# Patient Record
Sex: Female | Born: 1965
Health system: Southern US, Community
[De-identification: ages and names within clinical notes are randomized; demographics above are authoritative.]

## PROBLEM LIST (undated history)

## (undated) DIAGNOSIS — J302 Other seasonal allergic rhinitis: Secondary | ICD-10-CM

## (undated) DIAGNOSIS — D649 Anemia, unspecified: Secondary | ICD-10-CM

## (undated) DIAGNOSIS — F419 Anxiety disorder, unspecified: Secondary | ICD-10-CM

## (undated) DIAGNOSIS — F32A Depression, unspecified: Secondary | ICD-10-CM

## (undated) DIAGNOSIS — K219 Gastro-esophageal reflux disease without esophagitis: Secondary | ICD-10-CM

## (undated) HISTORY — DX: Other seasonal allergic rhinitis: J30.2

## (undated) HISTORY — DX: Depression, unspecified: F32.A

## (undated) HISTORY — DX: Anxiety disorder, unspecified: F41.9

---

## 1991-08-10 HISTORY — PX: CHOLECYSTECTOMY: SHX55

## 2005-03-15 ENCOUNTER — Ambulatory Visit (HOSPITAL_COMMUNITY): Admission: RE | Admit: 2005-03-15 | Discharge: 2005-03-15 | Payer: Self-pay | Admitting: Obstetrics and Gynecology

## 2009-06-20 ENCOUNTER — Ambulatory Visit (HOSPITAL_COMMUNITY): Admission: RE | Admit: 2009-06-20 | Discharge: 2009-06-20 | Payer: Self-pay | Admitting: Family Medicine

## 2011-06-11 ENCOUNTER — Other Ambulatory Visit (HOSPITAL_COMMUNITY): Payer: Self-pay | Admitting: Family Medicine

## 2011-06-11 DIAGNOSIS — Z139 Encounter for screening, unspecified: Secondary | ICD-10-CM

## 2011-06-14 ENCOUNTER — Ambulatory Visit (HOSPITAL_COMMUNITY)
Admission: RE | Admit: 2011-06-14 | Discharge: 2011-06-14 | Disposition: A | Discharge status: Home / Self Care | Payer: Self-pay | Source: Ambulatory Visit | Attending: Family Medicine | Admitting: Family Medicine

## 2011-06-14 DIAGNOSIS — Z139 Encounter for screening, unspecified: Secondary | ICD-10-CM

## 2011-12-11 ENCOUNTER — Emergency Department (HOSPITAL_COMMUNITY): Payer: Self-pay

## 2011-12-11 ENCOUNTER — Encounter (HOSPITAL_COMMUNITY): Payer: Self-pay | Admitting: *Deleted

## 2011-12-11 ENCOUNTER — Emergency Department (HOSPITAL_COMMUNITY)
Admission: EM | Admit: 2011-12-11 | Discharge: 2011-12-11 | Disposition: A | Discharge status: Home / Self Care | Payer: Self-pay | Attending: Emergency Medicine | Admitting: Emergency Medicine

## 2011-12-11 DIAGNOSIS — M79609 Pain in unspecified limb: Secondary | ICD-10-CM | POA: Insufficient documentation

## 2011-12-11 DIAGNOSIS — R51 Headache: Secondary | ICD-10-CM | POA: Insufficient documentation

## 2011-12-11 DIAGNOSIS — R209 Unspecified disturbances of skin sensation: Secondary | ICD-10-CM | POA: Insufficient documentation

## 2011-12-11 DIAGNOSIS — R200 Anesthesia of skin: Secondary | ICD-10-CM

## 2011-12-11 DIAGNOSIS — M79602 Pain in left arm: Secondary | ICD-10-CM

## 2011-12-11 DIAGNOSIS — M79605 Pain in left leg: Secondary | ICD-10-CM

## 2011-12-11 LAB — CBC WITH DIFFERENTIAL/PLATELET
Basophils Absolute: 0 10*3/uL (ref 0.0–0.1)
Basophils Relative: 1 % (ref 0–1)
Eosinophils Absolute: 0.2 10*3/uL (ref 0.0–0.7)
Eosinophils Relative: 2 % (ref 0–5)
Hemoglobin: 11.4 g/dL — ABNORMAL LOW (ref 12.0–15.0)
Lymphs Abs: 2.5 10*3/uL (ref 0.7–4.0)
MCH: 28.9 pg (ref 26.0–34.0)
Neutro Abs: 5.6 10*3/uL (ref 1.7–7.7)
Neutrophils Relative %: 63 % (ref 43–77)
RBC: 3.95 MIL/uL (ref 3.87–5.11)

## 2011-12-11 LAB — BASIC METABOLIC PANEL
CO2: 27 mEq/L (ref 19–32)
Calcium: 9.6 mg/dL (ref 8.4–10.5)
GFR calc non Af Amer: >90 mL/min (ref >90)
Sodium: 138 mEq/L (ref 135–145)

## 2011-12-11 MED ORDER — SODIUM CHLORIDE 0.9 % IV BOLUS (SEPSIS)
1000.0000 mL | Freq: Once | INTRAVENOUS | Status: AC
  Administered 2011-12-11: 1000 mL via INTRAVENOUS

## 2011-12-11 MED ORDER — DIPHENHYDRAMINE HCL 50 MG/ML IJ SOLN
25.0000 mg | Freq: Once | INTRAMUSCULAR | Status: AC
  Administered 2011-12-11: 25 mg via INTRAVENOUS
  Filled 2011-12-11: qty 1

## 2011-12-11 MED ORDER — KETOROLAC TROMETHAMINE 30 MG/ML IJ SOLN
30.0000 mg | Freq: Once | INTRAMUSCULAR | Status: AC
  Administered 2011-12-11: 30 mg via INTRAVENOUS
  Filled 2011-12-11: qty 1

## 2011-12-11 MED ORDER — METOCLOPRAMIDE HCL 10 MG PO TABS: 10.0000 mg | ORAL_TABLET | Freq: Four times a day (QID) | ORAL | Status: DC | PRN

## 2011-12-11 MED ORDER — METOCLOPRAMIDE HCL 5 MG/ML IJ SOLN
10.0000 mg | Freq: Once | INTRAMUSCULAR | Status: AC
  Administered 2011-12-11: 10 mg via INTRAVENOUS
  Filled 2011-12-11: qty 2

## 2011-12-11 NOTE — ED Notes (Signed)
Pt states numbness to left side of face which began at 0800. Also states shooting pains down left arm and other areas of body intermittently x 2 weeks. NAD.

## 2011-12-11 NOTE — ED Provider Notes (Signed)
History  This chart was scribed for Dione Booze, MD by Shari Heritage. The patient was seen in room APA08/APA08. Patient's care was started at 1036.     CSN: [id number]  Arrival date & time 12/11/11  7829   First MD Initiated Contact with Patient 12/11/11 1036      Chief Complaint  Patient presents with  . Numbness     The history is provided by the patient. No language interpreter was used.    HPI Comments: Cindy Nixon is a 46 y.o. female who presents to the Emergency Department complaining of numbness to the left side of her face onset about 3 hours ago at 8:00am. Patient is also complaining of a 1 hour episode of moderate to severe, constant sharp pain that traveled from her left arm down, down her torso and to her left leg. Patient rates that pain as 10/10. Other symptoms include moderate HA and nausea. Patient rates HA pain as 5/10. She also reports that she has had some numbness in her hands over the past few days. Patient says that she is being monitored for borderline stroke.  PCP - [facility]  History reviewed. No pertinent past medical history.  Past Surgical History  Procedure Date  . Cholecystectomy     No family history on file.  History  Substance Use Topics  . Smoking status: Never Smoker   . Smokeless tobacco: Not on file  . Alcohol Use: No    OB History    Grav Para Term Preterm Abortions TAB SAB Ect Mult Living                  Review of Systems  Gastrointestinal: Positive for nausea. Negative for vomiting.  Musculoskeletal: Positive for myalgias.  Neurological: Positive for numbness.    Allergies  Review of patient's allergies indicates no known allergies.  Home Medications   Current Outpatient Rx  Name Route Sig Dispense Refill  . CALCIUM PO Oral Take 1 tablet by mouth daily.    . IRON PO Oral Take 1 tablet by mouth 2 (two) times a week.    . ADULT MULTIVITAMIN W/MINERALS CH Oral Take 1 tablet by mouth daily.     Marland Kitchen NAPROXEN SODIUM 220 MG PO TABS Oral Take 220 mg by mouth 2 (two) times daily with a meal. Pain    . NORGESTIMATE-ETH ESTRADIOL 0.25-35 MG-MCG PO TABS Oral Take 1 tablet by mouth daily.    Marland Kitchen POTASSIUM PO Oral Take 1 tablet by mouth daily.    Marland Kitchen PSEUDOEPH-DOXYLAMINE-DM-APAP 60-12.08-07-998 MG/30ML PO LIQD Oral Take 15 mLs by mouth every 6 (six) hours as needed. Cold Symptoms    . PSEUDOEPHEDRINE-ACETAMINOPHEN 30-500 MG PO TABS Oral Take 2 tablets by mouth every 4 (four) hours as needed. Sinus Pain    . RANITIDINE HCL 150 MG PO TABS Oral Take 150 mg by mouth daily as needed. Acid Reflux      BP 151/69  Pulse 76  Temp 98.3 F (36.8 C) (Oral)  Resp 22  Ht 5' (1.524 m)  Wt 298 lb (135.172 kg)  BMI 58.20 kg/m2  SpO2 99%  LMP 12/10/2011  Physical Exam  Nursing note and vitals reviewed. Constitutional: She is oriented to person, place, and time. She appears well-developed and well-nourished.       Obese.  HENT:  Head: Normocephalic and atraumatic.  Nose: Right sinus exhibits maxillary sinus tenderness and frontal sinus tenderness. Left sinus exhibits maxillary sinus tenderness and frontal sinus tenderness.  Mild tenderness to palpation over frontal and maxillary sinuses.  Eyes: EOM are normal. Pupils are equal, round, and reactive to light.       Fundi are normal.   Cardiovascular: Normal rate and regular rhythm.   Pulmonary/Chest: Effort normal and breath sounds normal. No respiratory distress.  Abdominal: Soft. Bowel sounds are normal.  Musculoskeletal: Normal range of motion.  Neurological: She is alert and oriented to person, place, and time.  Skin: Skin is warm and dry.  Psychiatric: She has a normal mood and affect. Her behavior is normal.    ED Course  Procedures (including critical care time) DIAGNOSTIC STUDIES: Oxygen Saturation is 99% on room air, normal by my interpretation.    COORDINATION OF CARE: 10:50am- Patient informed of current plan for treatment and  evaluation and agrees with plan at this time.   Results for orders placed during the hospital encounter of 12/11/11  CBC WITH DIFFERENTIAL      Component Value Range   WBC 8.8  4.0 - 10.5 K/uL   RBC 3.95  3.87 - 5.11 MIL/uL   Hemoglobin 11.4 (*) 12.0 - 15.0 g/dL   HCT 40.1 (*) 02.7 - 25.3 %   MCV 89.6  78.0 - 100.0 fL   MCH 28.9  26.0 - 34.0 pg   MCHC 32.2  30.0 - 36.0 g/dL   RDW 66.4 (*) 40.3 - 47.4 %   Platelets 325  150 - 400 K/uL   Neutrophils Relative 63  43 - 77 %   Neutro Abs 5.6  1.7 - 7.7 K/uL   Lymphocytes Relative 28  12 - 46 %   Lymphs Abs 2.5  0.7 - 4.0 K/uL   Monocytes Relative 6  3 - 12 %   Monocytes Absolute 0.6  0.1 - 1.0 K/uL   Eosinophils Relative 2  0 - 5 %   Eosinophils Absolute 0.2  0.0 - 0.7 K/uL   Basophils Relative 1  0 - 1 %   Basophils Absolute 0.0  0.0 - 0.1 K/uL  BASIC METABOLIC PANEL      Component Value Range   Sodium 138  135 - 145 mEq/L   Potassium 4.1  3.5 - 5.1 mEq/L   Chloride 102  96 - 112 mEq/L   CO2 27  19 - 32 mEq/L   Glucose, Bld 91  70 - 99 mg/dL   BUN 12  6 - 23 mg/dL   Creatinine, Ser 2.59  0.50 - 1.10 mg/dL   Calcium 9.6  8.4 - 56.3 mg/dL   GFR calc non Af Amer >90  >90 mL/min   GFR calc Af Amer >90  >90 mL/min     Ct Head Wo Contrast  12/11/2011  *RADIOLOGY REPORT*  Clinical Data: Left-sided numbness.  CT HEAD WITHOUT CONTRAST  Technique:  Contiguous axial images were obtained from the base of the skull through the vertex without contrast.  Comparison: None.  Findings: No acute intracranial abnormality.  Specifically, no hemorrhage, hydrocephalus, mass lesion, acute infarction, or significant intracranial injury.  No acute calvarial abnormality. Visualized paranasal sinuses and mastoids clear.  Orbital soft tissues unremarkable.  IMPRESSION: No acute intracranial abnormality.   Original Report Authenticated By: Cyndie Chime, M.D.      1. Headache   2. Facial numbness   3. Pain of left arm   4. Pain of left leg        MDM  Transient numbness of uncertain cause. With numbness being the only symptom, I do  not feel there is enough to warrant TIA workup. Arm and leg pain of uncertain cause, but they have resolved. Headache which may be a migraine variant. CT scan will be obtained and she will be given a headache cocktail.  She feels much better after the headache cocktail. She is in a prescription for metoclopramide. Remainder of workup was unremarkable.  I personally performed the services described in this documentation, which was scribed in my presence. The recorded information has been reviewed and considered.      Dione Booze, MD 12/11/11 1248

## 2012-06-12 ENCOUNTER — Other Ambulatory Visit (HOSPITAL_COMMUNITY): Payer: Self-pay | Admitting: *Deleted

## 2012-06-12 ENCOUNTER — Other Ambulatory Visit (HOSPITAL_COMMUNITY): Payer: Self-pay | Admitting: Family Medicine

## 2012-06-12 DIAGNOSIS — Z1231 Encounter for screening mammogram for malignant neoplasm of breast: Secondary | ICD-10-CM

## 2012-06-16 ENCOUNTER — Ambulatory Visit (HOSPITAL_COMMUNITY)
Admission: RE | Admit: 2012-06-16 | Discharge: 2012-06-16 | Disposition: A | Discharge status: Home / Self Care | Payer: Self-pay | Source: Ambulatory Visit | Attending: *Deleted | Admitting: *Deleted

## 2012-06-16 DIAGNOSIS — Z1231 Encounter for screening mammogram for malignant neoplasm of breast: Secondary | ICD-10-CM

## 2012-06-18 ENCOUNTER — Other Ambulatory Visit: Payer: Self-pay | Admitting: *Deleted

## 2012-06-18 DIAGNOSIS — R928 Other abnormal and inconclusive findings on diagnostic imaging of breast: Secondary | ICD-10-CM

## 2012-06-24 ENCOUNTER — Other Ambulatory Visit: Payer: Self-pay | Admitting: *Deleted

## 2012-06-24 ENCOUNTER — Other Ambulatory Visit (HOSPITAL_COMMUNITY): Payer: Self-pay | Admitting: *Deleted

## 2012-06-24 ENCOUNTER — Ambulatory Visit (HOSPITAL_COMMUNITY)
Admission: RE | Admit: 2012-06-24 | Discharge: 2012-06-24 | Disposition: A | Discharge status: Home / Self Care | Payer: Self-pay | Source: Ambulatory Visit | Attending: *Deleted | Admitting: *Deleted

## 2012-06-24 DIAGNOSIS — N63 Unspecified lump in unspecified breast: Secondary | ICD-10-CM | POA: Insufficient documentation

## 2012-06-24 DIAGNOSIS — R928 Other abnormal and inconclusive findings on diagnostic imaging of breast: Secondary | ICD-10-CM | POA: Insufficient documentation

## 2012-06-24 DIAGNOSIS — IMO0002 Reserved for concepts with insufficient information to code with codable children: Secondary | ICD-10-CM

## 2012-06-26 ENCOUNTER — Other Ambulatory Visit: Payer: Self-pay | Admitting: Obstetrics and Gynecology

## 2012-06-26 DIAGNOSIS — N6320 Unspecified lump in the left breast, unspecified quadrant: Secondary | ICD-10-CM

## 2012-07-01 ENCOUNTER — Ambulatory Visit (HOSPITAL_COMMUNITY): Payer: Self-pay

## 2012-07-07 ENCOUNTER — Ambulatory Visit (HOSPITAL_COMMUNITY): Payer: Self-pay

## 2012-07-07 ENCOUNTER — Ambulatory Visit
Admission: RE | Admit: 2012-07-07 | Discharge: 2012-07-07 | Disposition: A | Discharge status: Home / Self Care | Payer: No Typology Code available for payment source | Source: Ambulatory Visit | Attending: Obstetrics and Gynecology | Admitting: Obstetrics and Gynecology

## 2012-07-07 ENCOUNTER — Encounter (HOSPITAL_COMMUNITY): Payer: Self-pay

## 2012-07-07 ENCOUNTER — Ambulatory Visit (HOSPITAL_COMMUNITY)
Admission: RE | Admit: 2012-07-07 | Discharge: 2012-07-07 | Disposition: A | Discharge status: Home / Self Care | Payer: Self-pay | Source: Ambulatory Visit | Attending: Obstetrics and Gynecology | Admitting: Obstetrics and Gynecology

## 2012-07-07 VITALS — BP 124/82 | Temp 98.0°F | Ht 60.0 in | Wt 325.2 lb

## 2012-07-07 DIAGNOSIS — Z1239 Encounter for other screening for malignant neoplasm of breast: Secondary | ICD-10-CM

## 2012-07-07 DIAGNOSIS — N6320 Unspecified lump in the left breast, unspecified quadrant: Secondary | ICD-10-CM

## 2012-07-07 NOTE — Progress Notes (Signed)
No complaints today. Patient referred to [facility] due to recommending a left breast biopsy per left breast diagnostic mammogram and ultrasound performed 06/24/2012 at [facility] mammography.  Pap Smear:    Pap smear not completed today. Last Pap smear was 06/11/2012 at the [facility] and normal. Per patient no history of an abnormal Pap smear. No Pap smear results in EPIC. Will scan last Pap smear in EPIC under media.  Physical exam: Breasts Breasts symmetrical. No skin abnormalities bilateral breasts. No nipple retraction bilateral breasts. No nipple discharge bilateral breasts. No lymphadenopathy. No lumps palpated bilateral breasts. Unable to palpate area of concern in the left breast at 3 o'clock. No complaints of pain or tenderness on exam. Referred patient to the [facility] for left breast biopsy per recommendation. Appointment scheduled for today at 0900.     Pelvic/Bimanual No Pap smear completed today since last Pap smear was 06/11/2012. Pap smear not indicated per BCCCP guidelines.

## 2012-07-07 NOTE — Patient Instructions (Signed)
Taught patient how to perform BSE. Patient did not need a Pap smear today due to last Pap smear was 06/11/2012. Let her know BCCCP will cover Pap smears every 3 years unless has a history of abnormal Pap smears. Referred patient to the [facility] for left breast biopsy per recommendation. Appointment scheduled for today at 0900. tPatient aware of appointment and will be there. Patient verbalized understanding.

## 2012-07-08 ENCOUNTER — Encounter (HOSPITAL_COMMUNITY): Payer: Self-pay

## 2012-07-14 ENCOUNTER — Ambulatory Visit (HOSPITAL_COMMUNITY): Payer: Self-pay

## 2012-07-17 ENCOUNTER — Encounter (INDEPENDENT_AMBULATORY_CARE_PROVIDER_SITE_OTHER): Payer: Self-pay | Admitting: Surgery

## 2012-07-17 ENCOUNTER — Other Ambulatory Visit (INDEPENDENT_AMBULATORY_CARE_PROVIDER_SITE_OTHER): Payer: Self-pay | Admitting: Surgery

## 2012-07-17 ENCOUNTER — Ambulatory Visit (INDEPENDENT_AMBULATORY_CARE_PROVIDER_SITE_OTHER): Payer: PRIVATE HEALTH INSURANCE | Admitting: Surgery

## 2012-07-17 VITALS — BP 142/74 | HR 60 | Resp 18 | Ht 60.0 in | Wt 321.0 lb

## 2012-07-17 DIAGNOSIS — N6320 Unspecified lump in the left breast, unspecified quadrant: Secondary | ICD-10-CM

## 2012-07-17 DIAGNOSIS — N63 Unspecified lump in unspecified breast: Secondary | ICD-10-CM

## 2012-07-17 NOTE — Patient Instructions (Signed)
We will schedule surgery to remove the lump recently found in your left breast

## 2012-07-17 NOTE — Progress Notes (Signed)
Patient ID: Cindy Nixon, female   DOB: 20-Feb-1966, 47 y.o.   MRN: [id number]  Chief Complaint  Patient presents with  . Breast Mass    left    HPI Cindy Nixon is a 47 y.o. female.  She had a routine mammogram and a well-circumscribed mass is noted in the lateral aspect of the left breast. A needle core biopsy was done showing a biphasic lesion which could be a fibroadenoma but could be a phyllodes tumor She has no breast symptoms or breast problems and has a negative family history for breast cancer.  HPI  Past Medical History  Diagnosis Date  . Seasonal allergies     Past Surgical History  Procedure Laterality Date  . Cholecystectomy  08/1991    Family History  Problem Relation Age of Onset  . Cancer Mother     ovarian  . Breast cancer Paternal Aunt   . Breast cancer Paternal Aunt   . Breast cancer Paternal Aunt     Social History History  Substance Use Topics  . Smoking status: Never Smoker   . Smokeless tobacco: Never Used  . Alcohol Use: No    No Known Allergies  Current Outpatient Prescriptions  Medication Sig Dispense Refill  . IRON PO Take 1 tablet by mouth 2 (two) times a week.      . Multiple Vitamin (MULTIVITAMIN WITH MINERALS) TABS Take 1 tablet by mouth daily.      . naproxen sodium (ALEVE) 220 MG tablet Take 220 mg by mouth 2 (two) times daily with a meal. Pain      . norgestimate-ethinyl estradiol (ORTHO-CYCLEN,SPRINTEC,PREVIFEM) 0.25-35 MG-MCG tablet Take 1 tablet by mouth daily.      Marland Kitchen POTASSIUM PO Take 1 tablet by mouth daily.      . pseudoephedrine-acetaminophen (TYLENOL SINUS) 30-500 MG TABS Take 2 tablets by mouth every 4 (four) hours as needed. Sinus Pain      . ranitidine (ZANTAC) 150 MG tablet Take 150 mg by mouth daily as needed. Acid Reflux      . Pseudoeph-Doxylamine-DM-APAP (NYQUIL D COLD/FLU) 60-12.08-07-998 MG/30ML LIQD Take 15 mLs by mouth every 6 (six) hours as needed. Cold Symptoms       No current facility-administered  medications for this visit.    Review of Systems Review of Systems  Constitutional: Negative for fever, chills and unexpected weight change.  HENT: Positive for ear pain and congestion. Negative for hearing loss, sore throat, trouble swallowing and voice change.   Eyes: Negative for visual disturbance.  Respiratory: Negative for cough and wheezing.   Cardiovascular: Negative for chest pain, palpitations and leg swelling.  Gastrointestinal: Negative for nausea, vomiting, abdominal pain, diarrhea, constipation, blood in stool, abdominal distention and anal bleeding.  Genitourinary: Negative for hematuria, vaginal bleeding and difficulty urinating.  Musculoskeletal: Negative for arthralgias.  Skin: Negative for rash and wound.  Neurological: Negative for seizures, syncope and headaches.  Hematological: Negative for adenopathy. Does not bruise/bleed easily.  Psychiatric/Behavioral: Negative for confusion.    Blood pressure 142/74, pulse 60, resp. rate 18, height 5' (1.524 m), weight 321 lb (145.605 kg), last menstrual period 07/06/2012.  Physical Exam Physical Exam  Vitals reviewed. Constitutional: She is oriented to person, place, and time. She appears well-developed and well-nourished. No distress.  HENT:  Head: Normocephalic and atraumatic.  Mouth/Throat: Oropharynx is clear and moist.  Eyes: Conjunctivae and EOM are normal. Pupils are equal, round, and reactive to light. No scleral icterus.  Right  Eye drifts lateral  Neck: Normal range of motion. Neck supple. No tracheal deviation present. No thyromegaly present.  Cardiovascular: Normal rate, regular rhythm, normal heart sounds and intact distal pulses.  Exam reveals no gallop and no friction rub.   No murmur heard. Pulmonary/Chest: Effort normal and breath sounds normal. No respiratory distress. She has no wheezes. She has no rales.    Well circumscribed 1.5 cm mass, ? Hematoma from Bx  Abdominal: Soft. Bowel sounds are normal.  She exhibits no distension and no mass. There is no tenderness. There is no rebound and no guarding.  Musculoskeletal: Normal range of motion. She exhibits no edema and no tenderness.  Lymphadenopathy:    She has cervical adenopathy.    She has no axillary adenopathy.       Right: No supraclavicular adenopathy present.       Left: No supraclavicular adenopathy present.  Neurological: She is alert and oriented to person, place, and time.  Skin: Skin is warm and dry. No rash noted. She is not diaphoretic. No erythema.  Psychiatric: She has a normal mood and affect. Her behavior is normal. Judgment and thought content normal.    Data Reviewed I reviewed the mammogram and ultrasounds and the pathology report from the biopsy  Assessment    Left breast mass, fibroadenoma versus phyllodes tumor  Morbid obesity    Plan    Excision. I have discussed the indications for the lumpectomy and described the procedure. She understand that the chance of removal of the abnormal area is very good, but that occasionally we are unable to locate it and may have to do a second procedure. We also discussed the possibility of a second procedure to get additional tissue. Risks of surgery such as bleeding and infection have also been explained, as well as the implications of not doing the surgery. She understands and wishes to proceed.         Pleasant Bensinger J 07/17/2012, 10:14 AM

## 2012-08-04 ENCOUNTER — Encounter (HOSPITAL_COMMUNITY): Payer: Self-pay | Admitting: Pharmacy Technician

## 2012-08-06 ENCOUNTER — Encounter (HOSPITAL_COMMUNITY): Payer: Self-pay

## 2012-08-06 ENCOUNTER — Encounter (HOSPITAL_COMMUNITY)
Admission: RE | Admit: 2012-08-06 | Discharge: 2012-08-06 | Disposition: A | Discharge status: Home / Self Care | Payer: No Typology Code available for payment source | Source: Ambulatory Visit | Attending: Surgery | Admitting: Surgery

## 2012-08-06 HISTORY — DX: Gastro-esophageal reflux disease without esophagitis: K21.9

## 2012-08-06 HISTORY — DX: Anemia, unspecified: D64.9

## 2012-08-06 LAB — BASIC METABOLIC PANEL
CO2: 24 mEq/L (ref 19–32)
Calcium: 9.6 mg/dL (ref 8.4–10.5)
Creatinine, Ser: 0.64 mg/dL (ref 0.50–1.10)
GFR calc non Af Amer: >90 mL/min (ref >90)
Glucose, Bld: 102 mg/dL — ABNORMAL HIGH (ref 70–99)

## 2012-08-06 LAB — CBC
Hemoglobin: 11.4 g/dL — ABNORMAL LOW (ref 12.0–15.0)
MCH: 28.5 pg (ref 26.0–34.0)
MCHC: 32.1 g/dL (ref 30.0–36.0)
MCV: 88.8 fL (ref 78.0–100.0)
Platelets: 317 10*3/uL (ref 150–400)
RBC: 4 MIL/uL (ref 3.87–5.11)

## 2012-08-06 NOTE — Pre-Procedure Instructions (Signed)
Cindy Nixon  08/06/2012   Your procedure is scheduled on:   Thursday  08/13/12     Report to [facility] at 900 AM.  Call this number if you have problems the morning of surgery: [phone]   Remember:   Do not eat food or drink liquids after midnight.   Take these medicines the morning of surgery with A SIP OF WATER:   ORTHOCYCLEN, ZANTAC, POTASSIUM ( STOP  ASPIRIN, COUMADIN, PLAVIX, EFFIENT, HERBAL MEDICINES)   Do not wear jewelry, make-up or nail polish.  Do not wear lotions, powders, or perfumes. You may wear deodorant.  Do not shave 48 hours prior to surgery. Men may shave face and neck.  Do not bring valuables to the hospital.  Contacts, dentures or bridgework may not be worn into surgery.  Leave suitcase in the car. After surgery it may be brought to your room.  For patients admitted to the hospital, checkout time is 11:00 AM the day of  discharge.   Patients discharged the day of surgery will not be allowed to drive  home.  Name and phone number of your driver: FAMILY OR FRIEND   Special Instructions: Shower using CHG 2 nights before surgery and the night before surgery.  If you shower the day of surgery use CHG.  Use special wash - you have one bottle of CHG for all showers.  You should use approximately 1/3 of the bottle for each shower.   Please read over the following fact sheets that you were given: Pain Booklet, Coughing and Deep Breathing, MRSA Information and Surgical Site Infection Prevention

## 2012-08-12 MED ORDER — DEXTROSE 5 % IV SOLN
3.0000 g | INTRAVENOUS | Status: AC
  Filled 2012-08-12: qty 30

## 2012-08-12 MED ORDER — CHLORHEXIDINE GLUCONATE 4 % EX LIQD
1.0000 "application " | Freq: Once | CUTANEOUS | Status: DC
  Filled 2012-08-12: qty 15

## 2012-08-13 ENCOUNTER — Encounter (HOSPITAL_COMMUNITY): Payer: Self-pay | Admitting: Anesthesiology

## 2012-08-13 ENCOUNTER — Encounter (HOSPITAL_COMMUNITY)
Admission: RE | Disposition: A | Discharge status: Home / Self Care | Payer: Self-pay | Source: Ambulatory Visit | Attending: Surgery

## 2012-08-13 ENCOUNTER — Ambulatory Visit (HOSPITAL_COMMUNITY): Payer: Self-pay | Admitting: Anesthesiology

## 2012-08-13 ENCOUNTER — Ambulatory Visit (HOSPITAL_COMMUNITY)
Admission: RE | Admit: 2012-08-13 | Discharge: 2012-08-13 | Disposition: A | Discharge status: Home / Self Care | Payer: MEDICAID | Source: Ambulatory Visit | Attending: Surgery | Admitting: Surgery

## 2012-08-13 ENCOUNTER — Ambulatory Visit
Admission: RE | Admit: 2012-08-13 | Discharge: 2012-08-13 | Disposition: A | Discharge status: Home / Self Care | Payer: No Typology Code available for payment source | Source: Ambulatory Visit | Attending: Surgery | Admitting: Surgery

## 2012-08-13 DIAGNOSIS — J309 Allergic rhinitis, unspecified: Secondary | ICD-10-CM | POA: Insufficient documentation

## 2012-08-13 DIAGNOSIS — N6320 Unspecified lump in the left breast, unspecified quadrant: Secondary | ICD-10-CM

## 2012-08-13 DIAGNOSIS — D249 Benign neoplasm of unspecified breast: Secondary | ICD-10-CM

## 2012-08-13 DIAGNOSIS — Z8041 Family history of malignant neoplasm of ovary: Secondary | ICD-10-CM | POA: Insufficient documentation

## 2012-08-13 DIAGNOSIS — Z803 Family history of malignant neoplasm of breast: Secondary | ICD-10-CM | POA: Insufficient documentation

## 2012-08-13 HISTORY — PX: BREAST BIOPSY: SHX20

## 2012-08-13 SURGERY — BREAST BIOPSY WITH NEEDLE LOCALIZATION
Anesthesia: General | Site: Breast | Laterality: Left | Wound class: Clean

## 2012-08-13 MED ORDER — ACETAMINOPHEN 10 MG/ML IV SOLN: 1000.0000 mg | Freq: Once | INTRAVENOUS | Status: DC | PRN

## 2012-08-13 MED ORDER — HYDROCODONE-ACETAMINOPHEN 5-325 MG PO TABS: 1.0000 | ORAL_TABLET | ORAL | Status: DC | PRN

## 2012-08-13 MED ORDER — ONDANSETRON HCL 4 MG/2ML IJ SOLN
INTRAMUSCULAR | Status: DC | PRN
  Administered 2012-08-13: 4 mg via INTRAVENOUS

## 2012-08-13 MED ORDER — LACTATED RINGERS IV SOLN
INTRAVENOUS | Status: DC
  Administered 2012-08-13: 11:00:00 via INTRAVENOUS

## 2012-08-13 MED ORDER — LACTATED RINGERS IV SOLN
INTRAVENOUS | Status: DC | PRN
  Administered 2012-08-13: 11:00:00 via INTRAVENOUS

## 2012-08-13 MED ORDER — BUPIVACAINE HCL (PF) 0.25 % IJ SOLN
INTRAMUSCULAR | Status: AC
  Filled 2012-08-13: qty 30

## 2012-08-13 MED ORDER — FENTANYL CITRATE 0.05 MG/ML IJ SOLN
INTRAMUSCULAR | Status: DC | PRN
  Administered 2012-08-13: 100 ug via INTRAVENOUS
  Administered 2012-08-13 (×2): 50 ug via INTRAVENOUS

## 2012-08-13 MED ORDER — BUPIVACAINE HCL (PF) 0.25 % IJ SOLN
INTRAMUSCULAR | Status: DC | PRN
  Administered 2012-08-13: 20 mL

## 2012-08-13 MED ORDER — PROPOFOL 10 MG/ML IV BOLUS
INTRAVENOUS | Status: DC | PRN
  Administered 2012-08-13: 200 mg via INTRAVENOUS

## 2012-08-13 MED ORDER — ONDANSETRON HCL 4 MG/2ML IJ SOLN: 4.0000 mg | Freq: Once | INTRAMUSCULAR | Status: DC | PRN

## 2012-08-13 MED ORDER — MIDAZOLAM HCL 5 MG/5ML IJ SOLN
INTRAMUSCULAR | Status: DC | PRN
  Administered 2012-08-13: 2 mg via INTRAVENOUS

## 2012-08-13 MED ORDER — LIDOCAINE HCL (CARDIAC) 10 MG/ML IV SOLN
INTRAVENOUS | Status: DC | PRN
  Administered 2012-08-13: 50 mg via INTRAVENOUS

## 2012-08-13 MED ORDER — DEXTROSE 5 % IV SOLN
3.0000 g | INTRAVENOUS | Status: DC | PRN
  Administered 2012-08-13: 3 g via INTRAVENOUS

## 2012-08-13 MED ORDER — 0.9 % SODIUM CHLORIDE (POUR BTL) OPTIME
TOPICAL | Status: DC | PRN
  Administered 2012-08-13: 1000 mL

## 2012-08-13 MED ORDER — HYDROMORPHONE HCL PF 1 MG/ML IJ SOLN: 0.2500 mg | INTRAMUSCULAR | Status: DC | PRN

## 2012-08-13 SURGICAL SUPPLY — 50 items
ADH SKN CLS APL DERMABOND .7 (GAUZE/BANDAGES/DRESSINGS) ×1
APPLIER CLIP 9.375 MED OPEN (MISCELLANEOUS)
APPLIER CLIP 9.375 SM OPEN (CLIP)
APR CLP MED 9.3 20 MLT OPN (MISCELLANEOUS)
APR CLP SM 9.3 20 MLT OPN (CLIP)
BINDER BREAST LRG (GAUZE/BANDAGES/DRESSINGS) IMPLANT
BINDER BREAST XLRG (GAUZE/BANDAGES/DRESSINGS) ×1 IMPLANT
BLADE SURG 10 STRL SS (BLADE) ×2 IMPLANT
BLADE SURG 15 STRL LF DISP TIS (BLADE) ×1 IMPLANT
BLADE SURG 15 STRL SS (BLADE) ×2
CANISTER SUCTION 2500CC (MISCELLANEOUS) ×2 IMPLANT
CHLORAPREP W/TINT 26ML (MISCELLANEOUS) ×2 IMPLANT
CLIP APPLIE 9.375 MED OPEN (MISCELLANEOUS) IMPLANT
CLIP APPLIE 9.375 SM OPEN (CLIP) IMPLANT
CLOTH BEACON ORANGE TIMEOUT ST (SAFETY) ×2 IMPLANT
COVER SURGICAL LIGHT HANDLE (MISCELLANEOUS) ×2 IMPLANT
DERMABOND ADVANCED (GAUZE/BANDAGES/DRESSINGS) ×1
DERMABOND ADVANCED .7 DNX12 (GAUZE/BANDAGES/DRESSINGS) ×1 IMPLANT
DEVICE DUBIN SPECIMEN MAMMOGRA (MISCELLANEOUS) ×2 IMPLANT
DRAPE CHEST BREAST 15X10 FENES (DRAPES) ×2 IMPLANT
ELECT CAUTERY BLADE 6.4 (BLADE) ×2 IMPLANT
ELECT REM PT RETURN 9FT ADLT (ELECTROSURGICAL) ×2
ELECTRODE REM PT RTRN 9FT ADLT (ELECTROSURGICAL) ×1 IMPLANT
GLOVE BIOGEL PI IND STRL 7.5 (GLOVE) IMPLANT
GLOVE BIOGEL PI IND STRL 8 (GLOVE) IMPLANT
GLOVE BIOGEL PI INDICATOR 7.5 (GLOVE) ×1
GLOVE BIOGEL PI INDICATOR 8 (GLOVE) ×1
GLOVE EUDERMIC 7 POWDERFREE (GLOVE) ×4 IMPLANT
GLOVE SURG SS PI 8.0 STRL IVOR (GLOVE) ×1 IMPLANT
GOWN PREVENTION PLUS XLARGE (GOWN DISPOSABLE) ×2 IMPLANT
GOWN STRL NON-REIN LRG LVL3 (GOWN DISPOSABLE) IMPLANT
GOWN STRL REIN 2XL LVL4 (GOWN DISPOSABLE) ×1 IMPLANT
KIT BASIN OR (CUSTOM PROCEDURE TRAY) ×2 IMPLANT
KIT MARKER MARGIN INK (KITS) ×1 IMPLANT
KIT ROOM TURNOVER OR (KITS) ×2 IMPLANT
NDL HYPO 25GX1X1/2 BEV (NEEDLE) ×1 IMPLANT
NEEDLE HYPO 25GX1X1/2 BEV (NEEDLE) ×2 IMPLANT
NS IRRIG 1000ML POUR BTL (IV SOLUTION) ×2 IMPLANT
PACK SURGICAL SETUP 50X90 (CUSTOM PROCEDURE TRAY) ×2 IMPLANT
PAD ARMBOARD 7.5X6 YLW CONV (MISCELLANEOUS) ×2 IMPLANT
PENCIL BUTTON HOLSTER BLD 10FT (ELECTRODE) ×2 IMPLANT
SPONGE LAP 4X18 X RAY DECT (DISPOSABLE) ×2 IMPLANT
SUT MON AB 4-0 PC3 18 (SUTURE) ×2 IMPLANT
SUT VIC AB 3-0 SH 18 (SUTURE) ×2 IMPLANT
SYR BULB 3OZ (MISCELLANEOUS) ×2 IMPLANT
SYR CONTROL 10ML LL (SYRINGE) ×2 IMPLANT
TOWEL OR 17X24 6PK STRL BLUE (TOWEL DISPOSABLE) ×2 IMPLANT
TOWEL OR 17X26 10 PK STRL BLUE (TOWEL DISPOSABLE) ×2 IMPLANT
TUBE CONNECTING 12X1/4 (SUCTIONS) ×1 IMPLANT
YANKAUER SUCT BULB TIP NO VENT (SUCTIONS) ×1 IMPLANT

## 2012-08-13 NOTE — H&P (View-Only) (Signed)
Patient ID: Cindy Nixon, female   DOB: 08/01/1965, 46 y.o.   MRN: [id number]  Chief Complaint  Patient presents with  . Breast Mass    left    HPI Cindy Nixon is a 46 y.o. female.  She had a routine mammogram and a well-circumscribed mass is noted in the lateral aspect of the left breast. A needle core biopsy was done showing a biphasic lesion which could be a fibroadenoma but could be a phyllodes tumor She has no breast symptoms or breast problems and has a negative family history for breast cancer.  HPI  Past Medical History  Diagnosis Date  . Seasonal allergies     Past Surgical History  Procedure Laterality Date  . Cholecystectomy  08/1991    Family History  Problem Relation Age of Onset  . Cancer Mother     ovarian  . Breast cancer Paternal Aunt   . Breast cancer Paternal Aunt   . Breast cancer Paternal Aunt     Social History History  Substance Use Topics  . Smoking status: Never Smoker   . Smokeless tobacco: Never Used  . Alcohol Use: No    No Known Allergies  Current Outpatient Prescriptions  Medication Sig Dispense Refill  . IRON PO Take 1 tablet by mouth 2 (two) times a week.      . Multiple Vitamin (MULTIVITAMIN WITH MINERALS) TABS Take 1 tablet by mouth daily.      . naproxen sodium (ALEVE) 220 MG tablet Take 220 mg by mouth 2 (two) times daily with a meal. Pain      . norgestimate-ethinyl estradiol (ORTHO-CYCLEN,SPRINTEC,PREVIFEM) 0.25-35 MG-MCG tablet Take 1 tablet by mouth daily.      . POTASSIUM PO Take 1 tablet by mouth daily.      . pseudoephedrine-acetaminophen (TYLENOL SINUS) 30-500 MG TABS Take 2 tablets by mouth every 4 (four) hours as needed. Sinus Pain      . ranitidine (ZANTAC) 150 MG tablet Take 150 mg by mouth daily as needed. Acid Reflux      . Pseudoeph-Doxylamine-DM-APAP (NYQUIL D COLD/FLU) 60-12.08-07-998 MG/30ML LIQD Take 15 mLs by mouth every 6 (six) hours as needed. Cold Symptoms       No current facility-administered  medications for this visit.    Review of Systems Review of Systems  Constitutional: Negative for fever, chills and unexpected weight change.  HENT: Positive for ear pain and congestion. Negative for hearing loss, sore throat, trouble swallowing and voice change.   Eyes: Negative for visual disturbance.  Respiratory: Negative for cough and wheezing.   Cardiovascular: Negative for chest pain, palpitations and leg swelling.  Gastrointestinal: Negative for nausea, vomiting, abdominal pain, diarrhea, constipation, blood in stool, abdominal distention and anal bleeding.  Genitourinary: Negative for hematuria, vaginal bleeding and difficulty urinating.  Musculoskeletal: Negative for arthralgias.  Skin: Negative for rash and wound.  Neurological: Negative for seizures, syncope and headaches.  Hematological: Negative for adenopathy. Does not bruise/bleed easily.  Psychiatric/Behavioral: Negative for confusion.    Blood pressure 142/74, pulse 60, resp. rate 18, height 5' (1.524 m), weight 321 lb (145.605 kg), last menstrual period 07/06/2012.  Physical Exam Physical Exam  Vitals reviewed. Constitutional: She is oriented to person, place, and time. She appears well-developed and well-nourished. No distress.  HENT:  Head: Normocephalic and atraumatic.  Mouth/Throat: Oropharynx is clear and moist.  Eyes: Conjunctivae and EOM are normal. Pupils are equal, round, and reactive to light. No scleral icterus.  Right  Eye drifts lateral    Neck: Normal range of motion. Neck supple. No tracheal deviation present. No thyromegaly present.  Cardiovascular: Normal rate, regular rhythm, normal heart sounds and intact distal pulses.  Exam reveals no gallop and no friction rub.   No murmur heard. Pulmonary/Chest: Effort normal and breath sounds normal. No respiratory distress. She has no wheezes. She has no rales.    Well circumscribed 1.5 cm mass, ? Hematoma from Bx  Abdominal: Soft. Bowel sounds are normal.  She exhibits no distension and no mass. There is no tenderness. There is no rebound and no guarding.  Musculoskeletal: Normal range of motion. She exhibits no edema and no tenderness.  Lymphadenopathy:    She has cervical adenopathy.    She has no axillary adenopathy.       Right: No supraclavicular adenopathy present.       Left: No supraclavicular adenopathy present.  Neurological: She is alert and oriented to person, place, and time.  Skin: Skin is warm and dry. No rash noted. She is not diaphoretic. No erythema.  Psychiatric: She has a normal mood and affect. Her behavior is normal. Judgment and thought content normal.    Data Reviewed I reviewed the mammogram and ultrasounds and the pathology report from the biopsy  Assessment    Left breast mass, fibroadenoma versus phyllodes tumor  Morbid obesity    Plan    Excision. I have discussed the indications for the lumpectomy and described the procedure. She understand that the chance of removal of the abnormal area is very good, but that occasionally we are unable to locate it and may have to do a second procedure. We also discussed the possibility of a second procedure to get additional tissue. Risks of surgery such as bleeding and infection have also been explained, as well as the implications of not doing the surgery. She understands and wishes to proceed.         Braxston Quinter J 07/17/2012, 10:14 AM    

## 2012-08-13 NOTE — Anesthesia Preprocedure Evaluation (Signed)
Anesthesia Evaluation  Patient identified by MRN, date of birth, ID band Patient awake    Reviewed: Allergy & Precautions, H&P , NPO status , Patient's Chart, lab work & pertinent test results  Airway Mallampati: II      Dental  (+) Teeth Intact, Partial Upper and Dental Advisory Given   Pulmonary  breath sounds clear to auscultation        Cardiovascular Rhythm:Regular Rate:Normal     Neuro/Psych    GI/Hepatic   Endo/Other    Renal/GU      Musculoskeletal   Abdominal (+) + obese,   Peds  Hematology   Anesthesia Other Findings   Reproductive/Obstetrics                           Anesthesia Physical Anesthesia Plan  ASA: III  Anesthesia Plan: General   Post-op Pain Management:    Induction: Intravenous  Airway Management Planned: LMA  Additional Equipment:   Intra-op Plan:   Post-operative Plan: Extubation in OR  Informed Consent: I have reviewed the patients History and Physical, chart, labs and discussed the procedure including the risks, benefits and alternatives for the proposed anesthesia with the patient or authorized representative who has indicated his/her understanding and acceptance.   Dental advisory given  Plan Discussed with: CRNA, Anesthesiologist and Surgeon  Anesthesia Plan Comments: (L. Breast Mass Obesity GERD asymptomatic  Plan GA with LMA  Kipp Brood, MD)        Anesthesia Quick Evaluation

## 2012-08-13 NOTE — Transfer of Care (Signed)
Immediate Anesthesia Transfer of Care Note  Patient: Cindy Nixon  Procedure(s) Performed: Procedure(s): NEEDLE LOCALIZATION left breast mass removal (Left)  Patient Location: PACU  Anesthesia Type:General  Level of Consciousness: awake, alert  and patient cooperative  Airway & Oxygen Therapy: Patient Spontanous Breathing and Patient connected to nasal cannula oxygen  Post-op Assessment: Report given to PACU RN, Post -op Vital signs reviewed and stable and Patient moving all extremities  Post vital signs: Reviewed and stable  Complications: No apparent anesthesia complications

## 2012-08-13 NOTE — Anesthesia Procedure Notes (Signed)
Procedure Name: LMA Insertion Date/Time: 08/13/2012 11:08 AM Performed by: Coralee Rud Pre-anesthesia Checklist: Patient identified, Emergency Drugs available, Suction available, Patient being monitored and Timeout performed Patient Re-evaluated:Patient Re-evaluated prior to inductionOxygen Delivery Method: Circle system utilized Preoxygenation: Pre-oxygenation with 100% oxygen Intubation Type: IV induction Ventilation: Mask ventilation with difficulty LMA: LMA inserted LMA Size: 4.0 Number of attempts: 1 Placement Confirmation: positive ETCO2 Tube secured with: Tape Dental Injury: Teeth and Oropharynx as per pre-operative assessment

## 2012-08-13 NOTE — Interval H&P Note (Signed)
History and Physical Interval Note:  08/13/2012 10:40 AM  Cindy Nixon  has presented today for surgery, with the diagnosis of left breast mass  The various methods of treatment have been discussed with the patient and family. After consideration of risks, benefits and other options for treatment, the patient has consented to  Procedure(s): NEEDLE LOCALIZATION left breast mass removal (Left) as a surgical intervention .  The patient's history has been reviewed, patient examined, no change in status, stable for surgery.  I have reviewed the patient's chart and labs.  Questions were answered to the patient's satisfaction.   I have reviewed the wire loc films and marked the left breast as the operative side   Cindy Nixon J

## 2012-08-13 NOTE — Preoperative (Signed)
Beta Blockers   Reason not to administer Beta Blockers:Not Applicable 

## 2012-08-13 NOTE — Anesthesia Postprocedure Evaluation (Signed)
  Anesthesia Post-op Note  Patient: Cindy Nixon  Procedure(s) Performed: Procedure(s): NEEDLE LOCALIZATION left breast mass removal (Left)  Patient Location: PACU  Anesthesia Type:General  Level of Consciousness: awake, alert  and oriented  Airway and Oxygen Therapy: Patient Spontanous Breathing and Patient connected to nasal cannula oxygen  Post-op Pain: mild  Post-op Assessment: Post-op Vital signs reviewed, Patient's Cardiovascular Status Stable, Respiratory Function Stable, Patent Airway and Pain level controlled  Post-op Vital Signs: stable  Complications: No apparent anesthesia complications

## 2012-08-13 NOTE — Op Note (Signed)
Cindy Nixon  1965-04-29  [id number]  08/13/2012   Preoperative diagnosis: Left breast mass, biphasic on NCB  Postoperative diagnosis: Same  Procedure: Wire localized excision  Surgeon: Currie Paris, MD, FACS  Anesthesia: General  Clinical History and Indications: this patient presents for a guidewire localized excision of a left breast mass.  Description of procedure: The patient was seen in the holding area and the plans for the procedure reviewed. The left breast was marked as the operative side. The wire localizing films were reviewed.The guidewire entered in the lower outer quadrant and tracked posterior and towards the nipple, entering the skin about 2 cm from the areolar margin  The patient was taken to the operating room and after satisfactory general anesthesia had been obtained the left breast was prepped and draped and the timeout was performed.  The incision was made at the areolar edge. Skin flaps were raised and using cautery the area was completely excised. As i was going posterior the lesion was visualized so I took some extra tissue posteriorly to be sure there was a margin, althouhg the actual specimen would appear in that area to have a + margin. Bleeders were controlled with either cautery or sutures as needed.  After achieving hemostasis, the incision was closed with 3-0 Vicryl, 4-0 Monocryl subcuticular, and Dermabond. The specimen mammogram confirmed the mass and the clip to be in the specimen  The patient tolerated the procedure well. There were no operative complications. All counts were correct.   EBL: Minimal  Currie Paris, MD, FACS 08/13/2012 11:49 AM

## 2012-08-14 ENCOUNTER — Telehealth (INDEPENDENT_AMBULATORY_CARE_PROVIDER_SITE_OTHER): Payer: Self-pay | Admitting: General Surgery

## 2012-08-14 ENCOUNTER — Encounter (HOSPITAL_COMMUNITY): Payer: Self-pay | Admitting: Surgery

## 2012-08-14 NOTE — Telephone Encounter (Signed)
Patient made aware of path results. Will follow up at appt and call with any questions prior.  

## 2012-08-14 NOTE — Telephone Encounter (Signed)
Message copied by Liliana Cline on Fri Aug 14, 2012  3:52 PM ------      Message from: Currie Paris      Created: Fri Aug 14, 2012  3:33 PM       Tell her path is benign and as expected ------

## 2012-09-08 ENCOUNTER — Encounter (INDEPENDENT_AMBULATORY_CARE_PROVIDER_SITE_OTHER): Payer: Self-pay | Admitting: Surgery

## 2012-09-08 ENCOUNTER — Ambulatory Visit (INDEPENDENT_AMBULATORY_CARE_PROVIDER_SITE_OTHER): Payer: No Typology Code available for payment source | Admitting: Surgery

## 2012-09-08 VITALS — BP 132/76 | HR 72 | Temp 97.8°F | Resp 16 | Ht 60.0 in | Wt 325.6 lb

## 2012-09-08 DIAGNOSIS — N6320 Unspecified lump in the left breast, unspecified quadrant: Secondary | ICD-10-CM

## 2012-09-08 DIAGNOSIS — N63 Unspecified lump in unspecified breast: Secondary | ICD-10-CM

## 2012-09-08 NOTE — Patient Instructions (Signed)
We will see you again on an as needed basis. Please call the office at [phone] if you have any questions or concerns. Thank you for allowing us to take care of you.  

## 2012-09-08 NOTE — Progress Notes (Signed)
NAME: Cindy Nixon                                            DOB: 04-02-1965 DATE: 09/08/2012                                                  MRN: [id number]  CC:  Chief Complaint  Patient presents with  . Routine Post Op    HPI: This patient comes in for post op follow-up .Sheunderwent removal of a left breast mass on 08/13/2012. She feels that she is doing well.  PE:  VITAL SIGNS: BP 132/76  Pulse 72  Temp(Src) 97.8 F (36.6 C) (Temporal)  Resp 16  Ht 5' (1.524 m)  Wt 325 lb 9.6 oz (147.691 kg)  BMI 63.59 kg/m2  General: The patient appears to be healthy, NAD Incision: Healing nicely.  DATA REVIEWED: Diagnosis Breast, lumpectomy, Left - FIBROADENOMA, NO ATYPIA OR MALIGNANCY. - INKED RESECTION MARGINS, NEGATIVE FOR ATYPIA OR MALIGNANCY. Abigail Miyamoto MD Pathologist, Electronic Signature (Case signed 08/14/2012)  IMPRESSION: The patient is doing well S/P removal of left breast mass.    PLAN: RTC PRN

## 2012-09-09 ENCOUNTER — Encounter (INDEPENDENT_AMBULATORY_CARE_PROVIDER_SITE_OTHER): Payer: Self-pay | Admitting: General Surgery

## 2012-09-09 ENCOUNTER — Telehealth (INDEPENDENT_AMBULATORY_CARE_PROVIDER_SITE_OTHER): Payer: Self-pay | Admitting: General Surgery

## 2012-09-09 NOTE — Telephone Encounter (Signed)
Message copied by Liliana Cline on Wed Sep 09, 2012 10:14 AM ------      Message from: Louie Casa      Created: Tue Sep 08, 2012  4:50 PM      Regarding: Dr. Jamey Ripa Rtn Work 09/09/2012      Contact: [phone]       Patient was here in the office Today and saw Dr. Jamey Ripa and needs a return work note for Tomorrow can fax at her Job fax# [phone].      Patient asked for a call once this is completed. ------

## 2012-09-09 NOTE — Telephone Encounter (Signed)
Return to work note written and faxed to the number provided. Confirmation received. I tried to call patient to make her aware with no answer and no way to leave message.

## 2013-09-30 ENCOUNTER — Other Ambulatory Visit (HOSPITAL_COMMUNITY): Payer: Self-pay | Admitting: *Deleted

## 2013-09-30 DIAGNOSIS — Z1231 Encounter for screening mammogram for malignant neoplasm of breast: Secondary | ICD-10-CM

## 2013-10-01 ENCOUNTER — Ambulatory Visit (HOSPITAL_COMMUNITY)
Admission: RE | Admit: 2013-10-01 | Discharge: 2013-10-01 | Disposition: A | Discharge status: Home / Self Care | Payer: BC Managed Care – PPO | Source: Ambulatory Visit | Attending: *Deleted | Admitting: *Deleted

## 2013-10-01 DIAGNOSIS — Z1231 Encounter for screening mammogram for malignant neoplasm of breast: Secondary | ICD-10-CM

## 2014-01-10 ENCOUNTER — Encounter (INDEPENDENT_AMBULATORY_CARE_PROVIDER_SITE_OTHER): Payer: Self-pay | Admitting: Surgery

## 2014-09-20 ENCOUNTER — Other Ambulatory Visit (HOSPITAL_COMMUNITY): Payer: Self-pay | Admitting: Nurse Practitioner

## 2014-09-20 DIAGNOSIS — Z1231 Encounter for screening mammogram for malignant neoplasm of breast: Secondary | ICD-10-CM

## 2014-10-05 ENCOUNTER — Other Ambulatory Visit (HOSPITAL_COMMUNITY): Payer: Self-pay | Admitting: Nurse Practitioner

## 2014-10-05 ENCOUNTER — Ambulatory Visit (HOSPITAL_COMMUNITY)
Admission: RE | Admit: 2014-10-05 | Discharge: 2014-10-05 | Disposition: A | Discharge status: Home / Self Care | Payer: PRIVATE HEALTH INSURANCE | Source: Ambulatory Visit | Attending: Nurse Practitioner | Admitting: Nurse Practitioner

## 2014-10-05 DIAGNOSIS — Z1231 Encounter for screening mammogram for malignant neoplasm of breast: Secondary | ICD-10-CM | POA: Insufficient documentation

## 2015-11-23 ENCOUNTER — Other Ambulatory Visit (HOSPITAL_COMMUNITY): Payer: Self-pay | Admitting: Nurse Practitioner

## 2015-11-23 DIAGNOSIS — Z1231 Encounter for screening mammogram for malignant neoplasm of breast: Secondary | ICD-10-CM

## 2015-11-29 ENCOUNTER — Encounter (HOSPITAL_COMMUNITY): Payer: Self-pay | Admitting: Radiology

## 2015-11-29 ENCOUNTER — Ambulatory Visit (HOSPITAL_COMMUNITY)
Admission: RE | Admit: 2015-11-29 | Discharge: 2015-11-29 | Disposition: A | Discharge status: Home / Self Care | Payer: PRIVATE HEALTH INSURANCE | Source: Ambulatory Visit | Attending: Nurse Practitioner | Admitting: Nurse Practitioner

## 2015-11-29 DIAGNOSIS — Z1231 Encounter for screening mammogram for malignant neoplasm of breast: Secondary | ICD-10-CM

## 2015-12-01 ENCOUNTER — Other Ambulatory Visit: Payer: Self-pay | Admitting: Nurse Practitioner

## 2015-12-01 DIAGNOSIS — R928 Other abnormal and inconclusive findings on diagnostic imaging of breast: Secondary | ICD-10-CM

## 2015-12-12 ENCOUNTER — Ambulatory Visit (HOSPITAL_COMMUNITY)
Admission: RE | Admit: 2015-12-12 | Discharge: 2015-12-12 | Disposition: A | Discharge status: Home / Self Care | Payer: PRIVATE HEALTH INSURANCE | Source: Ambulatory Visit | Attending: Nurse Practitioner | Admitting: Nurse Practitioner

## 2015-12-12 DIAGNOSIS — R928 Other abnormal and inconclusive findings on diagnostic imaging of breast: Secondary | ICD-10-CM | POA: Insufficient documentation

## 2016-06-11 ENCOUNTER — Other Ambulatory Visit (HOSPITAL_COMMUNITY): Payer: Self-pay | Admitting: *Deleted

## 2016-06-11 DIAGNOSIS — Z09 Encounter for follow-up examination after completed treatment for conditions other than malignant neoplasm: Secondary | ICD-10-CM

## 2016-07-02 ENCOUNTER — Ambulatory Visit (HOSPITAL_COMMUNITY)
Admission: RE | Admit: 2016-07-02 | Discharge: 2016-07-02 | Disposition: A | Discharge status: Home / Self Care | Payer: PRIVATE HEALTH INSURANCE | Source: Ambulatory Visit | Attending: *Deleted | Admitting: *Deleted

## 2016-07-02 ENCOUNTER — Encounter (HOSPITAL_COMMUNITY): Payer: Self-pay | Admitting: Radiology

## 2016-07-02 DIAGNOSIS — Z09 Encounter for follow-up examination after completed treatment for conditions other than malignant neoplasm: Secondary | ICD-10-CM

## 2016-07-02 DIAGNOSIS — N6310 Unspecified lump in the right breast, unspecified quadrant: Secondary | ICD-10-CM | POA: Insufficient documentation

## 2016-10-30 ENCOUNTER — Ambulatory Visit (INDEPENDENT_AMBULATORY_CARE_PROVIDER_SITE_OTHER): Payer: BLUE CROSS/BLUE SHIELD

## 2016-10-30 ENCOUNTER — Ambulatory Visit (INDEPENDENT_AMBULATORY_CARE_PROVIDER_SITE_OTHER): Payer: BLUE CROSS/BLUE SHIELD | Admitting: Adult Health

## 2016-10-30 ENCOUNTER — Encounter (INDEPENDENT_AMBULATORY_CARE_PROVIDER_SITE_OTHER): Payer: Self-pay

## 2016-10-30 ENCOUNTER — Encounter: Payer: Self-pay | Admitting: Adult Health

## 2016-10-30 VITALS — BP 120/70 | HR 66 | Ht 60.0 in | Wt 368.8 lb

## 2016-10-30 DIAGNOSIS — F329 Major depressive disorder, single episode, unspecified: Secondary | ICD-10-CM | POA: Diagnosis not present

## 2016-10-30 DIAGNOSIS — N950 Postmenopausal bleeding: Secondary | ICD-10-CM | POA: Diagnosis not present

## 2016-10-30 LAB — POCT HEMOGLOBIN: HEMOGLOBIN: 13.3 g/dL (ref 12.2–16.2)

## 2016-10-30 MED ORDER — ESCITALOPRAM OXALATE 10 MG PO TABS: 10.0000 mg | ORAL_TABLET | Freq: Every day | ORAL | 6 refills | Status: DC

## 2016-10-30 NOTE — Progress Notes (Signed)
Subjective:     Patient ID: Cindy Nixon, female   DOB: 10/09/1965, 51 y.o.   MRN: [id number]  HPI Kaena is a 51 year old black female, married in complaining of vaginal bleeding for a month with clots, She had not had period for over a year and then had bleeding, health dept had her on OCs on and off and she has been back on for about a week now. She has nieces with her.  Review of Systems Vaginal bleeding with clots for 1 month Dizzy at times and short of breath with exertion  Reviewed past medical,surgical, social and family history. Reviewed medications and allergies.     Objective:   Physical Exam BP 120/70 (BP Location: Left Arm, Patient Position: Sitting, Cuff Size: Large)   Pulse 66   Ht 5' (1.524 m)   Wt (!) 368 lb 12.8 oz (167.3 kg)   LMP 10/02/2016 (Approximate)   BMI 72.03 kg/m HGB 13.3 Skin warm and dry. Neck: mid line trachea, normal thyroid, good ROM, no lymphadenopathy noted. Lungs: clear to ausculation bilaterally. Cardiovascular: regular rate and rhythm. Pelvic: external genitalia is normal in appearance no lesions, vagina: period like blood without odor,urethra has no lesions or masses noted, cervix: bulbous,poorly seen uterus: normal size, shape and contour, non tender, no masses felt, adnexa: no masses or tenderness noted. Bladder is non tender and no masses felt.Difficult secondary to abdominal girth. PHQ 9 score 24, denies being suicidal or homicidal, but is depressed, as cared for sick daughter and has 65 year old granddaughter who had open heart surgery , she is open to taking meds, will try lexapro.  Will Get [place] ASAP to assess uterus, and will check some labs, stop OCs now.     Assessment:     1. PMB (postmenopausal bleeding)   2. Reactive depression       Plan:     Check CBC,CMP,TSH  Return this afternoon at 2 pm for GYN [place]  Rx lexapro 10 mg #30 take 1 daily with 6 refills Will talk this pm when [place] results back

## 2016-10-30 NOTE — Progress Notes (Addendum)
PELVIC US TA/TV: heterogeneous retroverted uterus w/mult linear striations and myometrial cysts (? Adenomyosis),normal ovaries bilat,thickened EEC 7.9 mm,no free fluid

## 2016-10-30 NOTE — Patient Instructions (Signed)
[place] in PM

## 2016-10-31 ENCOUNTER — Telehealth: Payer: Self-pay | Admitting: Adult Health

## 2016-10-31 LAB — COMPREHENSIVE METABOLIC PANEL
A/G RATIO: 1.1 — AB (ref 1.2–2.2)
ALK PHOS: 88 IU/L (ref 39–117)
ALT: 20 IU/L (ref 0–32)
AST: 20 IU/L (ref 0–40)
Albumin: 4.1 g/dL (ref 3.5–5.5)
BILIRUBIN TOTAL: 0.3 mg/dL (ref 0.0–1.2)
BUN/Creatinine Ratio: 18 (ref 9–23)
BUN: 11 mg/dL (ref 6–24)
CHLORIDE: 102 mmol/L (ref 96–106)
CO2: 22 mmol/L (ref 20–29)
Calcium: 9.3 mg/dL (ref 8.7–10.2)
Creatinine, Ser: 0.62 mg/dL (ref 0.57–1.00)
GFR calc Af Amer: 119 mL/min/{1.73_m2} (ref >59)
GFR calc non Af Amer: 103 mL/min/{1.73_m2} (ref >59)
GLOBULIN, TOTAL: 3.8 g/dL (ref 1.5–4.5)
Glucose: 103 mg/dL — ABNORMAL HIGH (ref 65–99)
POTASSIUM: 4.2 mmol/L (ref 3.5–5.2)
SODIUM: 143 mmol/L (ref 134–144)
Total Protein: 7.9 g/dL (ref 6.0–8.5)

## 2016-10-31 LAB — CBC
Hematocrit: 35.3 % (ref 34.0–46.6)
Hemoglobin: 11.2 g/dL (ref 11.1–15.9)
MCH: 27.7 pg (ref 26.6–33.0)
MCHC: 31.7 g/dL (ref 31.5–35.7)
MCV: 87 fL (ref 79–97)
PLATELETS: 354 10*3/uL (ref 150–379)
RBC: 4.04 x10E6/uL (ref 3.77–5.28)
RDW: 17.8 % — AB (ref 12.3–15.4)
WBC: 9.2 10*3/uL (ref 3.4–10.8)

## 2016-10-31 LAB — TSH: TSH: 2.18 u[IU]/mL (ref 0.450–4.500)

## 2016-10-31 MED ORDER — MEGESTROL ACETATE 40 MG PO TABS: ORAL_TABLET | ORAL | 0 refills | Status: DC

## 2016-10-31 NOTE — Telephone Encounter (Signed)
Pt aware of labs look good, and that US showed thickened endometrium and ?adenomyosis, ovaries normal.Will get endometrial biopsy next weeks with Dr Elonda Husky at her request,  and will rx megace to stop bleeding now.

## 2016-11-05 ENCOUNTER — Ambulatory Visit (INDEPENDENT_AMBULATORY_CARE_PROVIDER_SITE_OTHER): Payer: BLUE CROSS/BLUE SHIELD | Admitting: Obstetrics & Gynecology

## 2016-11-05 ENCOUNTER — Encounter: Payer: Self-pay | Admitting: Obstetrics & Gynecology

## 2016-11-05 ENCOUNTER — Other Ambulatory Visit: Payer: Self-pay | Admitting: Obstetrics & Gynecology

## 2016-11-05 VITALS — BP 138/84 | HR 79 | Ht 60.0 in | Wt 369.0 lb

## 2016-11-05 DIAGNOSIS — N950 Postmenopausal bleeding: Secondary | ICD-10-CM

## 2016-11-05 DIAGNOSIS — R9389 Abnormal findings on diagnostic imaging of other specified body structures: Secondary | ICD-10-CM

## 2016-11-05 DIAGNOSIS — R938 Abnormal findings on diagnostic imaging of other specified body structures: Secondary | ICD-10-CM

## 2016-11-05 NOTE — Progress Notes (Signed)
Endometrial Biopsy Procedure Note  Pre-operative Diagnosis: Postmenopausal bleeding with 8 mm symmetrical endometrium  Post-operative Diagnosis: same  Indications: postmenopausal bleeding  Procedure Details   Urine pregnancy test was not done.  The risks (including infection, bleeding, pain, and uterine perforation) and benefits of the procedure were explained to the patient and Written informed consent was obtained.  Antibiotic prophylaxis against endocarditis was not indicated.   The patient was placed in the dorsal lithotomy position.  Bimanual exam showed the uterus to be in the neutral position.  A Graves' speculum inserted in the vagina, and the cervix prepped with povidone iodine.  Endocervical curettage with a Kevorkian curette was not performed.   A sharp tenaculum was applied to the anterior lip of the cervix for stabilization.  A sterile uterine sound was used to sound the uterus to a depth of 6.5cm.  A Mylex 40mm curette was used to sample the endometrium.  Sample was sent for pathologic examination.  Condition: Stable  Complications: None  Plan:  The patient was advised to call for any fever or for prolonged or severe pain or bleeding. She was advised to use OTC ibuprofen as needed for mild to moderate pain. She was advised to avoid vaginal intercourse for 48 hours or until the bleeding has completely stopped.  Attending Physician Documentation: I was present for or performed the following: Endometrial biopsy The patient will be seen back in one week for follow-up of results

## 2016-11-14 ENCOUNTER — Ambulatory Visit (INDEPENDENT_AMBULATORY_CARE_PROVIDER_SITE_OTHER): Payer: BLUE CROSS/BLUE SHIELD | Admitting: Obstetrics & Gynecology

## 2016-11-14 ENCOUNTER — Encounter: Payer: Self-pay | Admitting: Obstetrics & Gynecology

## 2016-11-14 VITALS — BP 108/70 | HR 78 | Wt 370.0 lb

## 2016-11-14 DIAGNOSIS — N950 Postmenopausal bleeding: Secondary | ICD-10-CM | POA: Diagnosis not present

## 2016-11-14 DIAGNOSIS — N938 Other specified abnormal uterine and vaginal bleeding: Secondary | ICD-10-CM | POA: Diagnosis not present

## 2016-11-14 MED ORDER — ALPRAZOLAM 1 MG PO TABS: 1.0000 mg | ORAL_TABLET | Freq: Two times a day (BID) | ORAL | 3 refills | Status: DC | PRN

## 2016-11-14 MED ORDER — MEDROXYPROGESTERONE ACETATE 10 MG PO TABS: 10.0000 mg | ORAL_TABLET | Freq: Every day | ORAL | 11 refills | Status: DC

## 2016-11-14 NOTE — Progress Notes (Signed)
Follow up appointment for results  Chief Complaint  Patient presents with  . Follow-up    result biopsy    Blood pressure 108/70, pulse 78, weight (!) 370 lb (167.8 kg), last menstrual period 10/02/2016.    Disordered proliferative endometrium as a result of patient's morbid obesity  MEDS ordered this encounter: Meds ordered this encounter  Medications  . medroxyPROGESTERone (PROVERA) 10 MG tablet    Sig: Take 1 tablet (10 mg total) by mouth daily.    Dispense:  30 tablet    Refill:  11  . ALPRAZolam (XANAX) 1 MG tablet    Sig: Take 1 tablet (1 mg total) by mouth 2 (two) times daily as needed for anxiety.    Dispense:  60 tablet    Refill:  3    Orders for this encounter: No orders of the defined types were placed in this encounter.   Impression: Menopausal bleeding secondary to endometrial stimulation from patient's morbid obesity  Plan: Cyclical provera 10 1st 10 days of each month(script is for everyday to lenghthen the prescription)  Follow Up: Return in about 6 months (around 05/14/2017) for Follow up, with Dr Elonda Husky.       Face to face time:  15 minutes  Greater than 50% of the visit time was spent in counseling and coordination of care with the patient.  The summary and outline of the counseling and care coordination is summarized in the note above.   All questions were answered.  Past Medical History:  Diagnosis Date  . Anemia   . GERD (gastroesophageal reflux disease)   . Seasonal allergies     Past Surgical History:  Procedure Laterality Date  . BREAST BIOPSY Left 08/13/2012   Procedure: NEEDLE LOCALIZATION left breast mass removal;  Surgeon: Haywood Lasso, MD;  Location: [facility];  Service: General;  Laterality: Left;  . CHOLECYSTECTOMY  08/1991    OB History    Gravida Para Term Preterm AB Living   1 1 1     1    SAB TAB Ectopic Multiple Live Births           1      No Known Allergies  Social History   Social History  . Marital  status: Married    Spouse name: N/A  . Number of children: N/A  . Years of education: N/A   Social History Main Topics  . Smoking status: Never Smoker  . Smokeless tobacco: Never Used  . Alcohol use No  . Drug use: No  . Sexual activity: Not Currently    Birth control/ protection: Post-menopausal   Other Topics Concern  . None   Social History Narrative  . None    Family History  Problem Relation Age of Onset  . Cancer Mother        ovarian  . Breast cancer Paternal Aunt   . Breast cancer Paternal Aunt   . Breast cancer Paternal Aunt   . COPD Father   . COPD Brother   . Other Daughter        cysts in both breast; had partial mastectomy both breasts  . Diabetes Daughter   . Ovarian cysts Daughter

## 2016-12-02 DIAGNOSIS — F411 Generalized anxiety disorder: Secondary | ICD-10-CM | POA: Diagnosis not present

## 2016-12-02 DIAGNOSIS — Z6841 Body Mass Index (BMI) 40.0 and over, adult: Secondary | ICD-10-CM | POA: Diagnosis not present

## 2016-12-03 ENCOUNTER — Telehealth: Payer: Self-pay | Admitting: *Deleted

## 2016-12-03 DIAGNOSIS — N6310 Unspecified lump in the right breast, unspecified quadrant: Secondary | ICD-10-CM

## 2016-12-03 DIAGNOSIS — R928 Other abnormal and inconclusive findings on diagnostic imaging of breast: Secondary | ICD-10-CM

## 2016-12-03 NOTE — Telephone Encounter (Signed)
Pt aware mammogram October 30 at 9 am

## 2016-12-23 ENCOUNTER — Telehealth: Payer: Self-pay | Admitting: *Deleted

## 2016-12-23 MED ORDER — MEGESTROL ACETATE 40 MG PO TABS: ORAL_TABLET | ORAL | 1 refills | Status: DC

## 2016-12-23 NOTE — Telephone Encounter (Signed)
Pt has been bleeding since 10/1, took provera 10/1-10/10. Heavy, will discuss with Dr Elonda Husky and call her back

## 2016-12-23 NOTE — Telephone Encounter (Signed)
Discussed with Dr Elonda Husky will  rx megace to stop bleeding

## 2016-12-23 NOTE — Addendum Note (Signed)
Addended by: Derrek Monaco A on: 12/23/2016 03:35 PM   Modules accepted: Orders

## 2017-01-07 ENCOUNTER — Encounter (HOSPITAL_COMMUNITY): Payer: Self-pay

## 2017-01-07 ENCOUNTER — Ambulatory Visit (HOSPITAL_COMMUNITY)
Admission: RE | Admit: 2017-01-07 | Discharge: 2017-01-07 | Disposition: A | Discharge status: Home / Self Care | Payer: BLUE CROSS/BLUE SHIELD | Source: Ambulatory Visit | Attending: Adult Health | Admitting: Adult Health

## 2017-01-07 DIAGNOSIS — N6310 Unspecified lump in the right breast, unspecified quadrant: Secondary | ICD-10-CM

## 2017-01-07 DIAGNOSIS — R928 Other abnormal and inconclusive findings on diagnostic imaging of breast: Secondary | ICD-10-CM | POA: Diagnosis not present

## 2017-02-04 DIAGNOSIS — D649 Anemia, unspecified: Secondary | ICD-10-CM | POA: Diagnosis not present

## 2017-02-04 DIAGNOSIS — Z6841 Body Mass Index (BMI) 40.0 and over, adult: Secondary | ICD-10-CM | POA: Diagnosis not present

## 2017-02-04 DIAGNOSIS — J0101 Acute recurrent maxillary sinusitis: Secondary | ICD-10-CM | POA: Diagnosis not present

## 2017-02-04 DIAGNOSIS — Z1389 Encounter for screening for other disorder: Secondary | ICD-10-CM | POA: Diagnosis not present

## 2017-02-04 DIAGNOSIS — R7303 Prediabetes: Secondary | ICD-10-CM | POA: Diagnosis not present

## 2017-02-04 DIAGNOSIS — F411 Generalized anxiety disorder: Secondary | ICD-10-CM | POA: Diagnosis not present

## 2017-03-07 ENCOUNTER — Telehealth: Payer: Self-pay | Admitting: *Deleted

## 2017-03-07 MED ORDER — ALPRAZOLAM 1 MG PO TABS: 1.0000 mg | ORAL_TABLET | Freq: Two times a day (BID) | ORAL | 3 refills | Status: DC | PRN

## 2017-03-07 MED ORDER — MEGESTROL ACETATE 40 MG PO TABS: ORAL_TABLET | ORAL | 1 refills | Status: DC

## 2017-03-07 NOTE — Telephone Encounter (Signed)
Pt requesting refills on megace and xanax, daughter having brain surgery next week in [place].will refill in Dr Brynda Greathouse absence

## 2017-03-14 ENCOUNTER — Other Ambulatory Visit: Payer: Self-pay | Admitting: Obstetrics & Gynecology

## 2017-05-16 ENCOUNTER — Other Ambulatory Visit: Payer: Self-pay | Admitting: Adult Health

## 2017-06-09 DIAGNOSIS — F411 Generalized anxiety disorder: Secondary | ICD-10-CM | POA: Diagnosis not present

## 2017-06-09 DIAGNOSIS — M79604 Pain in right leg: Secondary | ICD-10-CM | POA: Diagnosis not present

## 2017-06-09 DIAGNOSIS — Z6841 Body Mass Index (BMI) 40.0 and over, adult: Secondary | ICD-10-CM | POA: Diagnosis not present

## 2017-06-13 ENCOUNTER — Encounter: Payer: Self-pay | Admitting: Adult Health

## 2017-06-13 ENCOUNTER — Ambulatory Visit (INDEPENDENT_AMBULATORY_CARE_PROVIDER_SITE_OTHER): Payer: BLUE CROSS/BLUE SHIELD | Admitting: Adult Health

## 2017-06-13 VITALS — BP 118/80 | HR 86 | Ht 60.0 in | Wt 365.0 lb

## 2017-06-13 DIAGNOSIS — N938 Other specified abnormal uterine and vaginal bleeding: Secondary | ICD-10-CM | POA: Insufficient documentation

## 2017-06-13 DIAGNOSIS — F329 Major depressive disorder, single episode, unspecified: Secondary | ICD-10-CM

## 2017-06-13 MED ORDER — MEDROXYPROGESTERONE ACETATE 10 MG PO TABS: 10.0000 mg | ORAL_TABLET | Freq: Every day | ORAL | 11 refills | Status: DC

## 2017-06-13 MED ORDER — MEGESTROL ACETATE 40 MG PO TABS: ORAL_TABLET | ORAL | 3 refills | Status: DC

## 2017-06-13 MED ORDER — ESCITALOPRAM OXALATE 20 MG PO TABS: 20.0000 mg | ORAL_TABLET | Freq: Every day | ORAL | 6 refills | Status: DC

## 2017-06-13 NOTE — Progress Notes (Signed)
Subjective:     Patient ID: Cindy Nixon, female   DOB: 03-Sep-1965, 52 y.o.   MRN: [id number]  HPI Cindy Nixon is a 52 year old black female, married, back in follow up on bleeding.She had PMB last year and had biopsy that showed proliferative  phase and was places on provera 10 mg every month by Dr Elonda Husky.She has bleeding every month, light then heavy, wers 2 pad, if bleeds over 7 days takes megace to stop it and it works well.Daughter had brain surgery in January and is doing well, and most recently husband had AKA and is in [place] for rehab.She says she had normal pap ion 2017 at health dept. PCP is Lanelle Bal in [place].   Review of Systems Bleeds every month after taking provera +family stress  Reviewed past medical,surgical, social and family history. Reviewed medications and allergies.     Objective:   Physical Exam BP 118/80 (BP Location: Left Arm, Patient Position: Sitting, Cuff Size: Normal)   Pulse 86   Ht 5' (1.524 m)   Wt (!) 365 lb (165.6 kg)   BMI 71.28 kg/m   Talk only.Discussed with Dr Elonda Husky and will continue provera 10 mg daily for 10 days every month and take megace as needed and will increase lexapro to 20 mg daily. And she is agreeable with this.     Assessment:     1. DUB (dysfunctional uterine bleeding)   2. Reactive depression       Plan:    Labs with PCP  Meds ordered this encounter  Medications  . medroxyPROGESTERone (PROVERA) 10 MG tablet    Sig: Take 1 tablet (10 mg total) by mouth daily.    Dispense:  30 tablet    Refill:  11    Order Specific Question:   Supervising Provider    Answer:   Elonda Husky, LUTHER H [[id number]]  . megestrol (MEGACE) 40 MG tablet    Sig: Take 3 x 5 days then 2 x 5 days then 1 daily    Dispense:  45 tablet    Refill:  3    Order Specific Question:   Supervising Provider    Answer:   Elonda Husky, LUTHER H [[id number]]  . escitalopram (LEXAPRO) 20 MG tablet    Sig: Take 1 tablet (20 mg total) by mouth daily.    Dispense:  30 tablet    Refill:  6   Order Specific Question:   Supervising Provider    Answer:   Tania Ade H [[id number]]  F/U in 6 months, or sooner if needed

## 2017-08-28 IMAGING — MG 2D DIGITAL DIAGNOSTIC UNILATERAL RIGHT MAMMOGRAM WITH CAD AND AD
2 series · 2 of 6 positions shown · non-contrast
Comparison: Previous exam(s).

ACR Breast Density Category a: The breast tissue is almost entirely
fatty.

CLINICAL DATA: Recall from screening mammography.

EXAM:
2D DIGITAL DIAGNOSTIC RIGHT MAMMOGRAM WITH CAD AND ADJUNCT TOMO
ULTRASOUND RIGHT BREAST

[R ML]
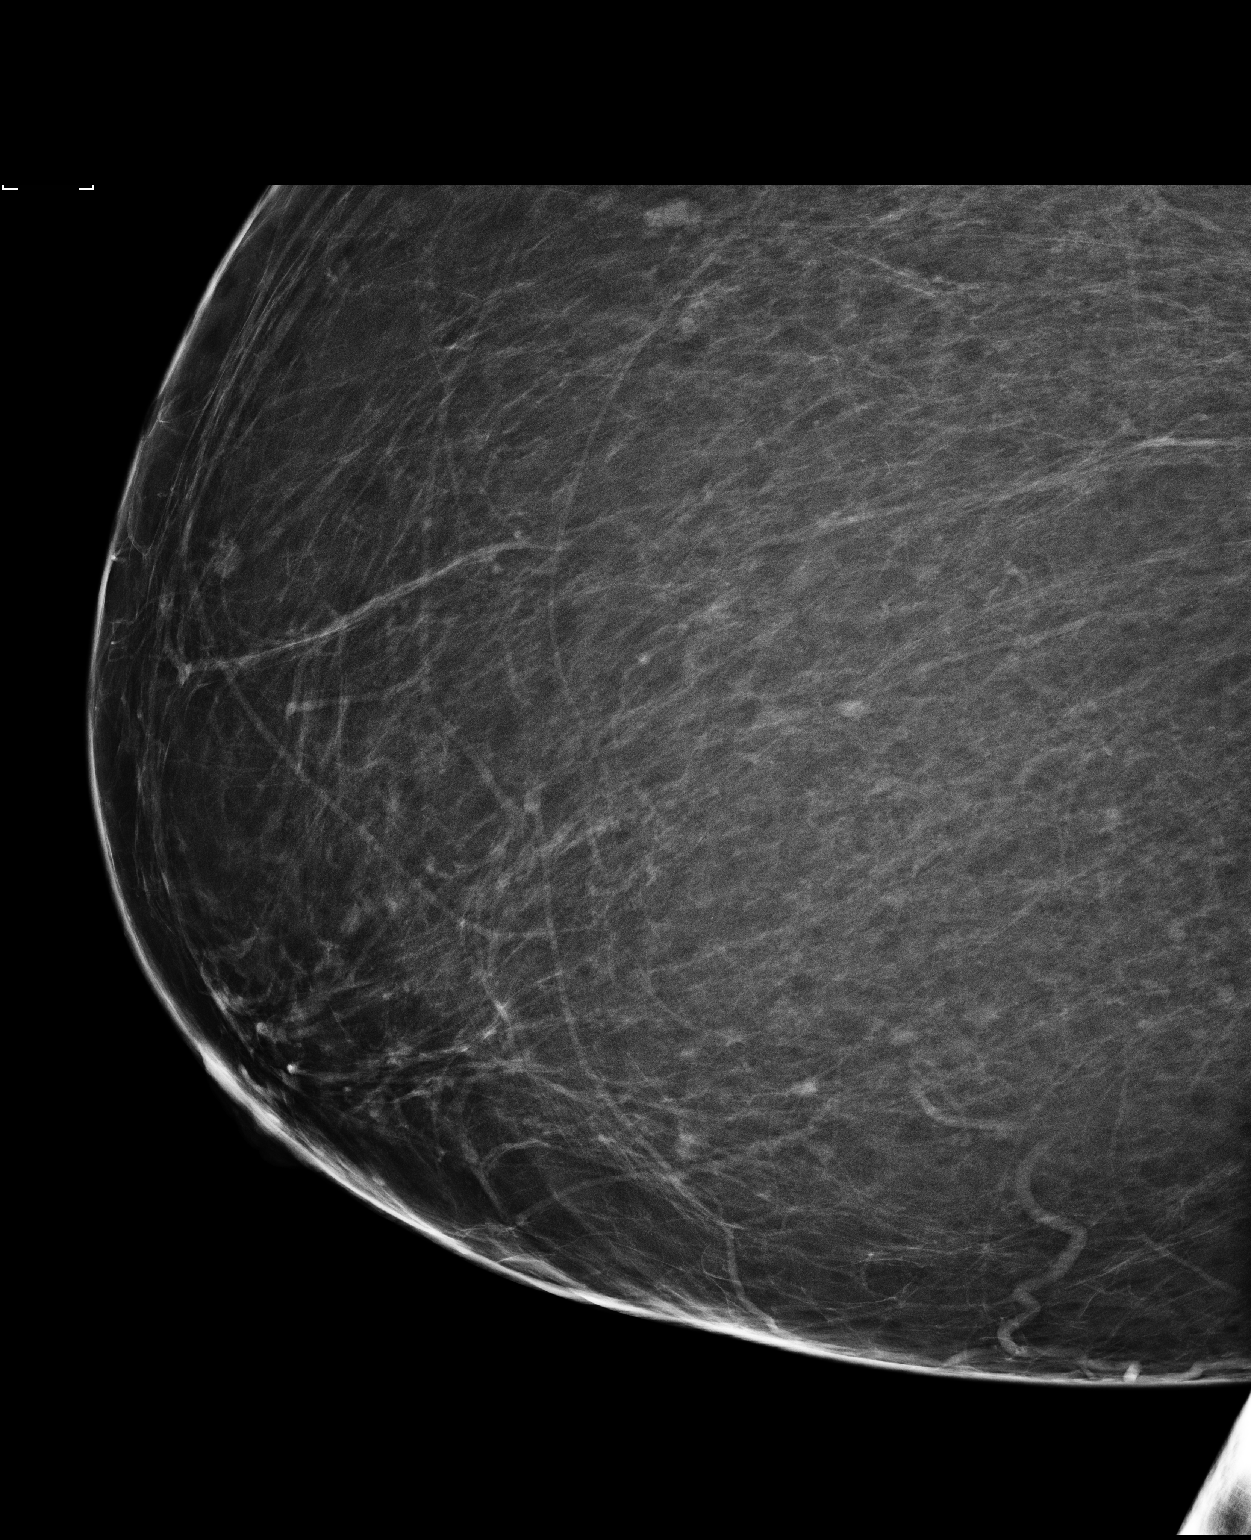

[R CC tomo · tomo slice 37/72.0]
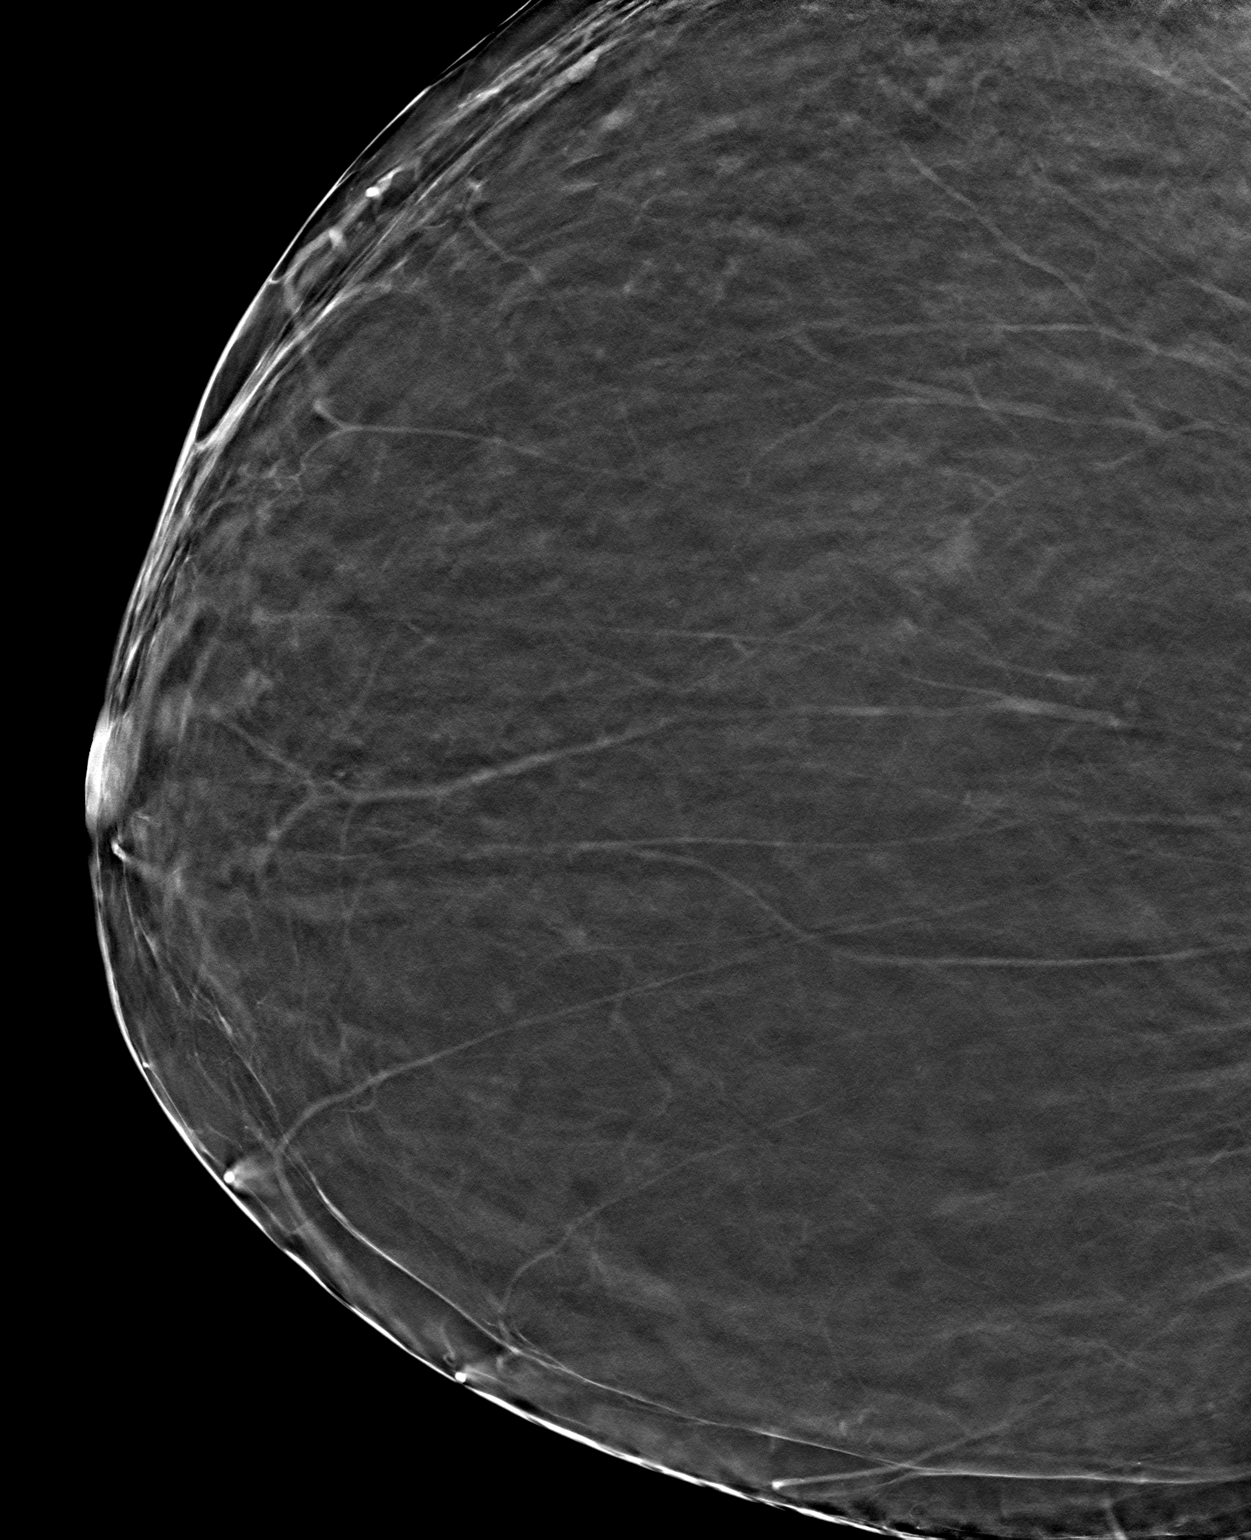

[2 of 6 positions shown; findings below may reference images not displayed]

FINDINGS: Additional views of the right breast demonstrate an oval,
circumscribed lesion with a cleft located within the superior
portion of the right breast (anterior [DATE]) at the 11 o'clock
position. This may have been present on the previous study dated
06/20/2009. However, due to the patient's large breast size and
differences in positioning, it is difficult to be certain that this
is the same lesion. On the additional views, most likely this
represents an intramammary lymph node.

Mammographic images were processed with CAD.

On physical exam, there is no discrete palpable abnormality in the
area of mammographic concern.

Targeted ultrasound is performed, showing an oval, circumscribed,
parallel hypoechoic lesion with central echogenicity located at the
11 o'clock position within the right breast 8 cm from the nipple
measuring 8 x 7 x 2 mm in size. Most likely this represents a benign
intramammary lymph node. I recommend followup right breast
diagnostic mammogram (and ultrasound at that time if felt needed) in
6 months.
IMPRESSION: Probably benign finding (probable intramammary lymph node) located
within the right breast at the 11 o'clock position 8 cm from the
nipple. Recommend follow-up right breast diagnostic mammogram 6
months ( and ultrasound at that time if felt needed).

RECOMMENDATION:
Right breast diagnostic mammogram and ultrasound at that time if
felt needed in 6 months.

I have discussed the findings and recommendations with the patient.
Results were also provided in writing at the conclusion of the
visit. If applicable, a reminder letter will be sent to the patient
regarding the next appointment.

BI-RADS CATEGORY  3: Probably benign.

## 2017-10-08 DIAGNOSIS — Z6841 Body Mass Index (BMI) 40.0 and over, adult: Secondary | ICD-10-CM | POA: Diagnosis not present

## 2017-10-08 DIAGNOSIS — D649 Anemia, unspecified: Secondary | ICD-10-CM | POA: Diagnosis not present

## 2017-10-08 DIAGNOSIS — F411 Generalized anxiety disorder: Secondary | ICD-10-CM | POA: Diagnosis not present

## 2017-10-08 DIAGNOSIS — M79604 Pain in right leg: Secondary | ICD-10-CM | POA: Diagnosis not present

## 2017-12-15 ENCOUNTER — Other Ambulatory Visit: Payer: Self-pay | Admitting: Adult Health

## 2017-12-23 ENCOUNTER — Other Ambulatory Visit: Payer: Self-pay | Admitting: Adult Health

## 2017-12-23 DIAGNOSIS — Z1231 Encounter for screening mammogram for malignant neoplasm of breast: Secondary | ICD-10-CM

## 2018-01-09 ENCOUNTER — Ambulatory Visit (HOSPITAL_COMMUNITY)
Admission: RE | Admit: 2018-01-09 | Discharge: 2018-01-09 | Disposition: A | Discharge status: Home / Self Care | Payer: BLUE CROSS/BLUE SHIELD | Source: Ambulatory Visit | Attending: Adult Health | Admitting: Adult Health

## 2018-01-09 DIAGNOSIS — Z1231 Encounter for screening mammogram for malignant neoplasm of breast: Secondary | ICD-10-CM | POA: Insufficient documentation

## 2018-01-22 ENCOUNTER — Telehealth: Payer: Self-pay | Admitting: *Deleted

## 2018-01-22 NOTE — Telephone Encounter (Signed)
Left message @ 11:49 am. JSY 

## 2018-01-23 ENCOUNTER — Other Ambulatory Visit: Payer: Self-pay | Admitting: Obstetrics & Gynecology

## 2018-01-23 MED ORDER — NAPROXEN SODIUM 550 MG PO TABS: 550.0000 mg | ORAL_TABLET | Freq: Two times a day (BID) | ORAL | 11 refills | Status: DC

## 2018-01-23 MED ORDER — MEGESTROL ACETATE 40 MG PO TABS: ORAL_TABLET | ORAL | 3 refills | Status: DC

## 2018-01-23 NOTE — Telephone Encounter (Signed)
Left message @ 8:42 am. JSY

## 2018-01-23 NOTE — Telephone Encounter (Signed)
done

## 2018-01-23 NOTE — Telephone Encounter (Signed)
Spoke with pt. Pt has refills on Megace but needs something for cramps. Pt is requesting Ibuprofen 800 mg. Please advise. Thanks!! Glades

## 2018-02-03 DIAGNOSIS — M170 Bilateral primary osteoarthritis of knee: Secondary | ICD-10-CM | POA: Diagnosis not present

## 2018-03-18 ENCOUNTER — Other Ambulatory Visit: Payer: Self-pay | Admitting: Adult Health

## 2018-05-12 ENCOUNTER — Other Ambulatory Visit: Payer: Self-pay | Admitting: Obstetrics & Gynecology

## 2018-05-18 DIAGNOSIS — M25562 Pain in left knee: Secondary | ICD-10-CM | POA: Diagnosis not present

## 2018-05-18 DIAGNOSIS — Z6841 Body Mass Index (BMI) 40.0 and over, adult: Secondary | ICD-10-CM | POA: Diagnosis not present

## 2018-05-18 DIAGNOSIS — M79604 Pain in right leg: Secondary | ICD-10-CM | POA: Diagnosis not present

## 2018-05-18 DIAGNOSIS — D649 Anemia, unspecified: Secondary | ICD-10-CM | POA: Diagnosis not present

## 2018-05-18 DIAGNOSIS — F411 Generalized anxiety disorder: Secondary | ICD-10-CM | POA: Diagnosis not present

## 2018-05-26 DIAGNOSIS — M170 Bilateral primary osteoarthritis of knee: Secondary | ICD-10-CM | POA: Diagnosis not present

## 2018-06-16 DIAGNOSIS — D649 Anemia, unspecified: Secondary | ICD-10-CM | POA: Diagnosis not present

## 2018-06-16 DIAGNOSIS — M79604 Pain in right leg: Secondary | ICD-10-CM | POA: Diagnosis not present

## 2018-06-16 DIAGNOSIS — F411 Generalized anxiety disorder: Secondary | ICD-10-CM | POA: Diagnosis not present

## 2018-06-16 DIAGNOSIS — M25562 Pain in left knee: Secondary | ICD-10-CM | POA: Diagnosis not present

## 2018-09-16 DIAGNOSIS — D649 Anemia, unspecified: Secondary | ICD-10-CM | POA: Diagnosis not present

## 2018-09-16 DIAGNOSIS — Z1389 Encounter for screening for other disorder: Secondary | ICD-10-CM | POA: Diagnosis not present

## 2018-09-16 DIAGNOSIS — M79604 Pain in right leg: Secondary | ICD-10-CM | POA: Diagnosis not present

## 2018-09-16 DIAGNOSIS — F411 Generalized anxiety disorder: Secondary | ICD-10-CM | POA: Diagnosis not present

## 2018-09-16 DIAGNOSIS — M25562 Pain in left knee: Secondary | ICD-10-CM | POA: Diagnosis not present

## 2018-09-24 IMAGING — MG 2D DIGITAL DIAGNOSTIC BILATERAL MAMMOGRAM WITH CAD AND ADJUNCT T
8 of 15 series · 8 of 31 positions shown · non-contrast
Comparison: Previous exam(s).

ACR Breast Density Category a: The breast tissue is almost entirely
fatty.

CLINICAL DATA: One year follow-up of probably benign findings in
the right breast and annual examination of both breasts. The patient
was recalled from a screening mammogram in December 2015 and a
probably benign benign mass was described in the 11 o'clock position
of the right breast.History of benign excisional biopsy of the left
breast in 1763.

EXAM:
2D DIGITAL DIAGNOSTIC BILATERAL MAMMOGRAM WITH CAD AND ADJUNCT TOMO

[R MLO (1 of 3)]
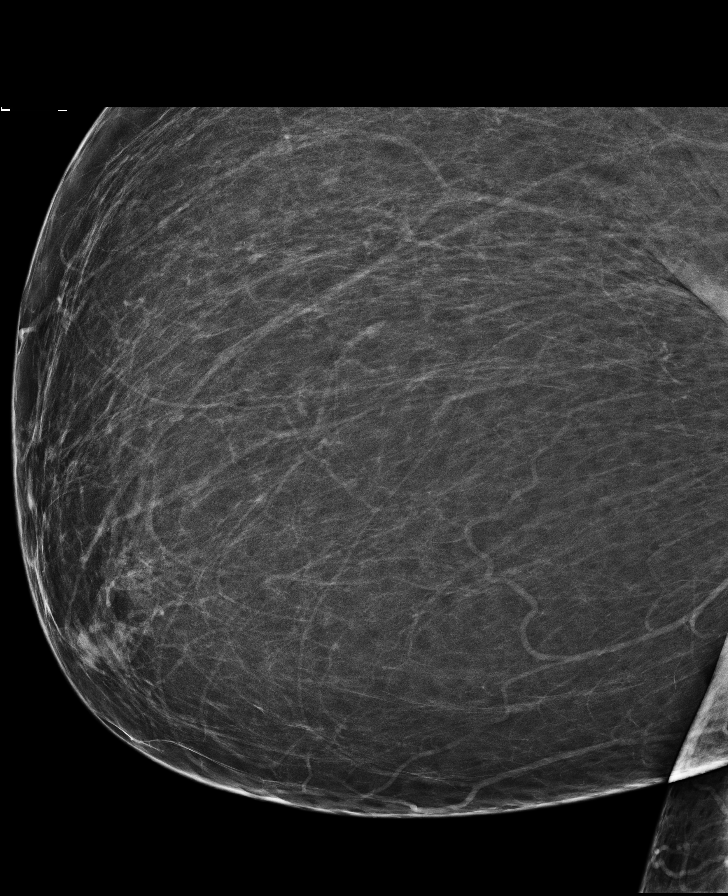

[L CC]
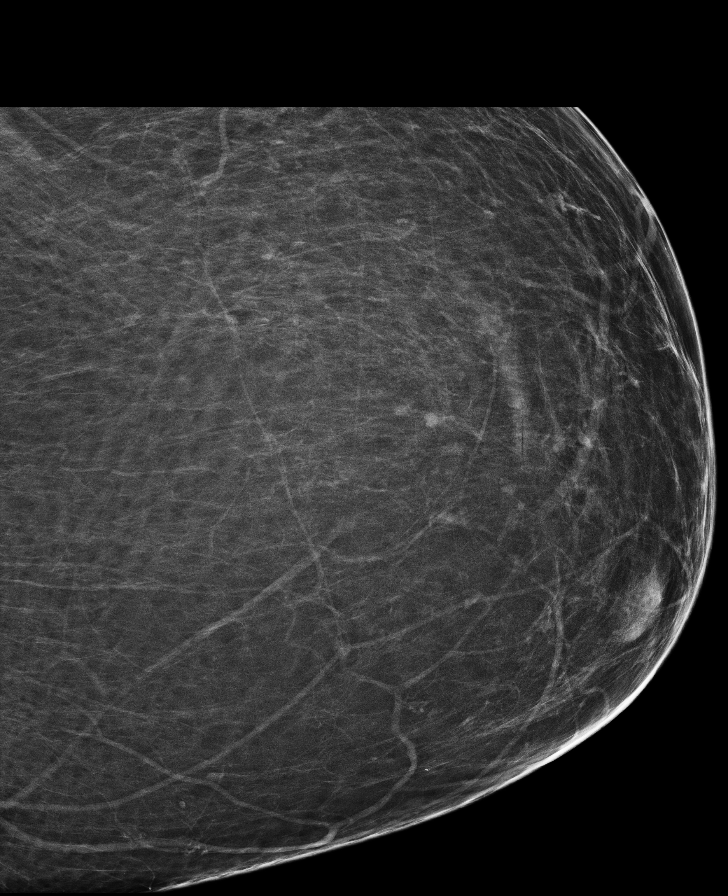

[L MLO (1 of 2)]
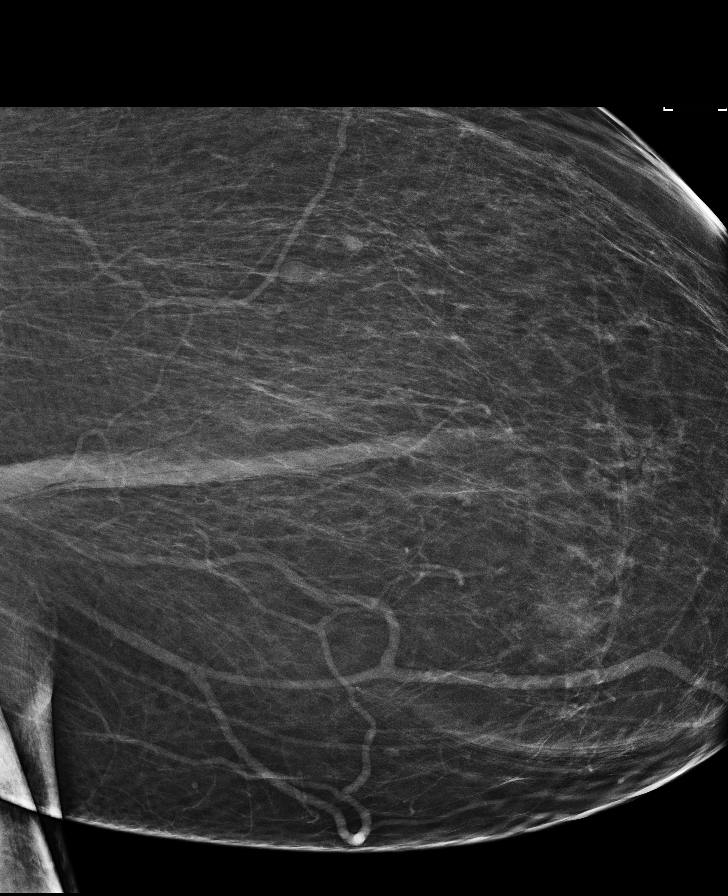

[R CC (1 of 2)]
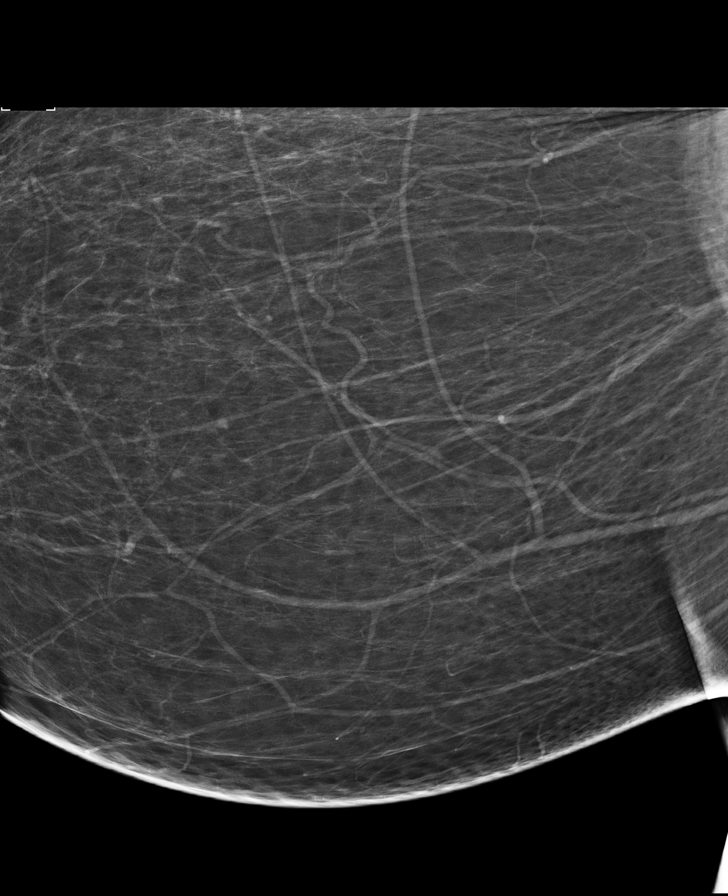

[R CC (2 of 2)]
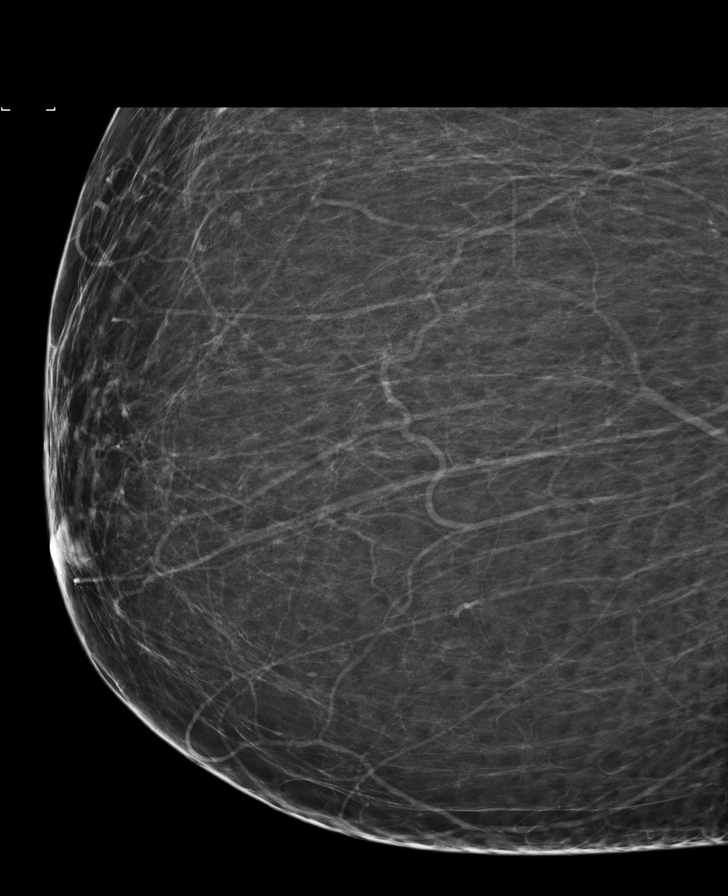

[R MLO (2 of 3)]
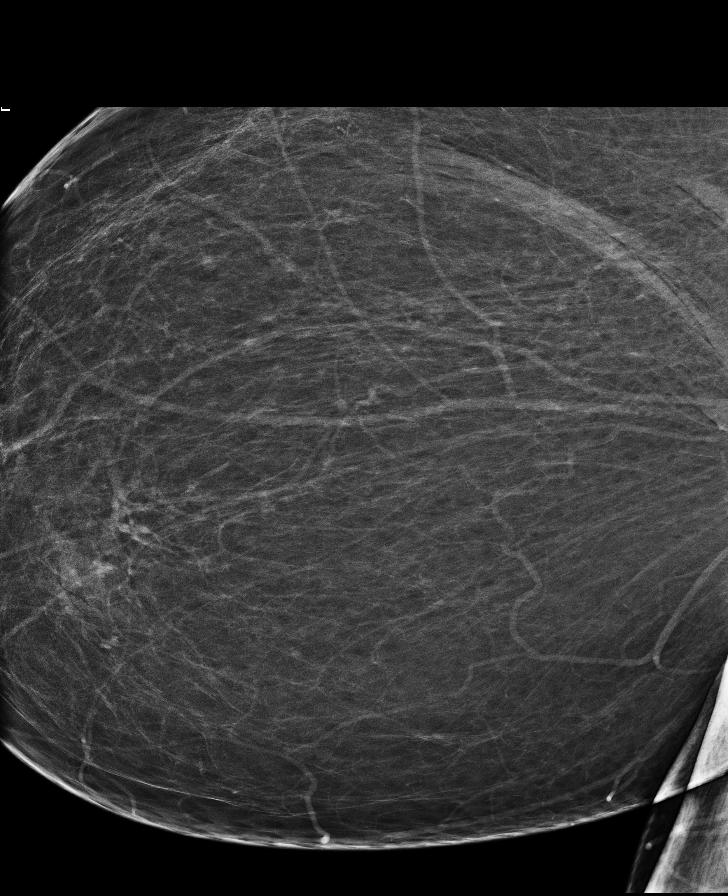

[L MLO (2 of 2)]
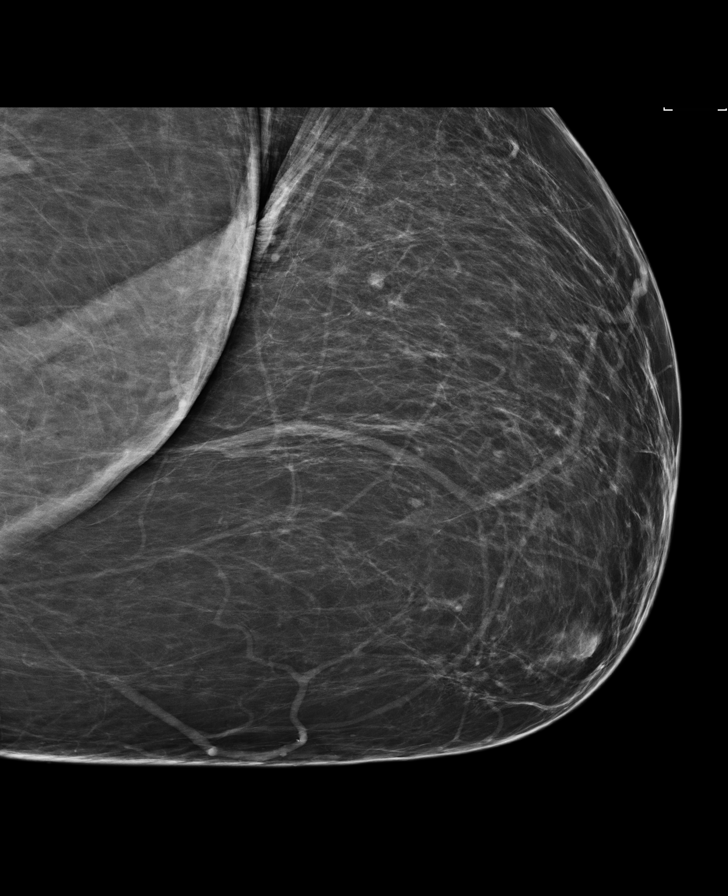

[R MLO (3 of 3)]
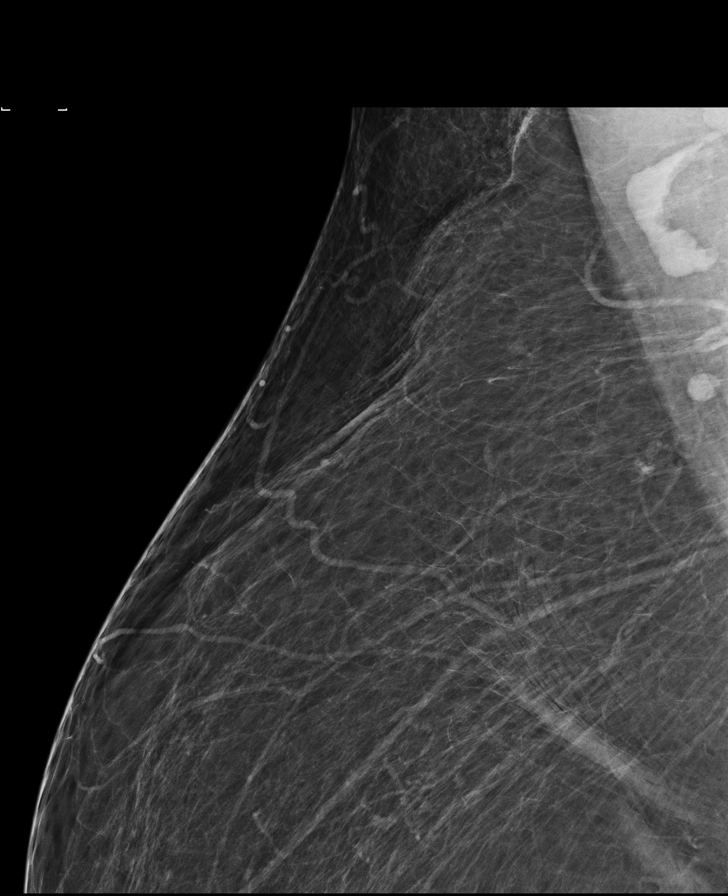

[8 of 31 positions shown; findings below may reference images not displayed]

FINDINGS: The previously described mass in the upper inner quadrant of the
right breast is no longer present, consistent with interval
resolution of a benign nodule. No new or suspicious mass,
nonsurgical architectural distortion, or suspicious
microcalcifications identified in either breast to suggest
malignancy.

Mammographic images were processed with CAD.
IMPRESSION: No evidence of malignancy in either breast.

RECOMMENDATION:
Screening mammogram in one year.(Code:LF-C-BS2)

I have discussed the findings and recommendations with the patient.
Results were also provided in writing at the conclusion of the
visit. If applicable, a reminder letter will be sent to the patient
regarding the next appointment.

BI-RADS CATEGORY  1: Negative.

## 2018-11-21 IMAGING — US ULTRASOUND RIGHT BREAST LIMITED
2 series · 13 of 25 positions shown · non-contrast
Comparison: Previous exam(s).

CLINICAL DATA: Followup for probably benign right breast mass.

EXAM:
2D DIGITAL DIAGNOSTIC UNILATERAL RIGHT MAMMOGRAM WITH CAD AND
ADJUNCT TOMO
RIGHT BREAST ULTRASOUND

[Series 1: ultrasound right breast limited · 0.06mm/px · 1 of 5 slices shown (1 of 2)]
[im 1/5]
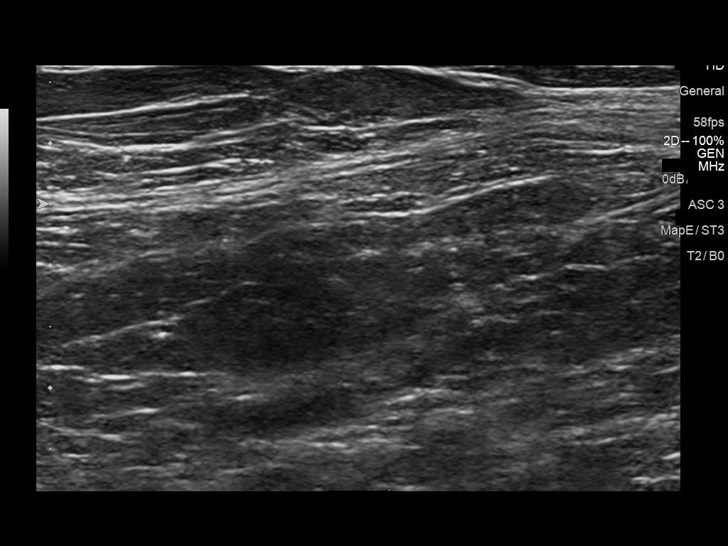

[Series 1: ultrasound right breast limited · 0.06mm/px · 12 of 68 slices shown (2 of 2)]
[im 1/68]
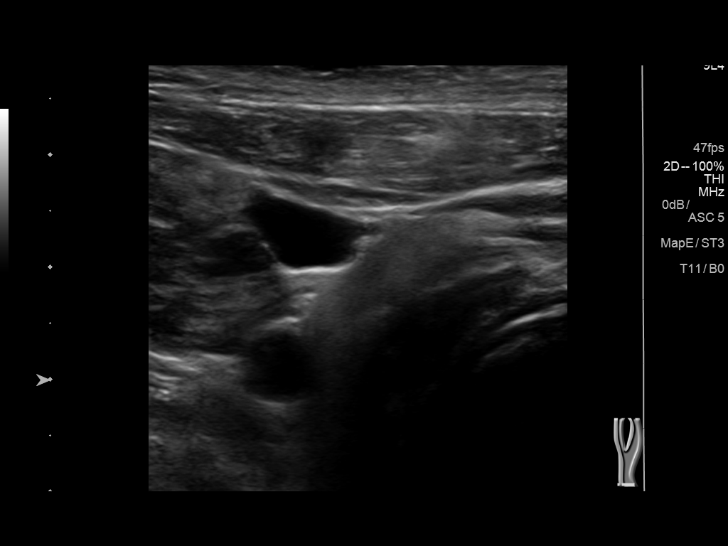
[im 7/68]
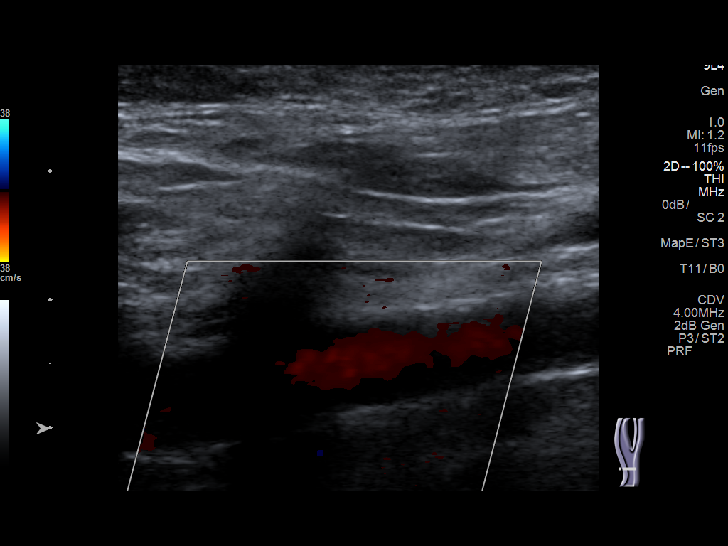
[im 13/68]
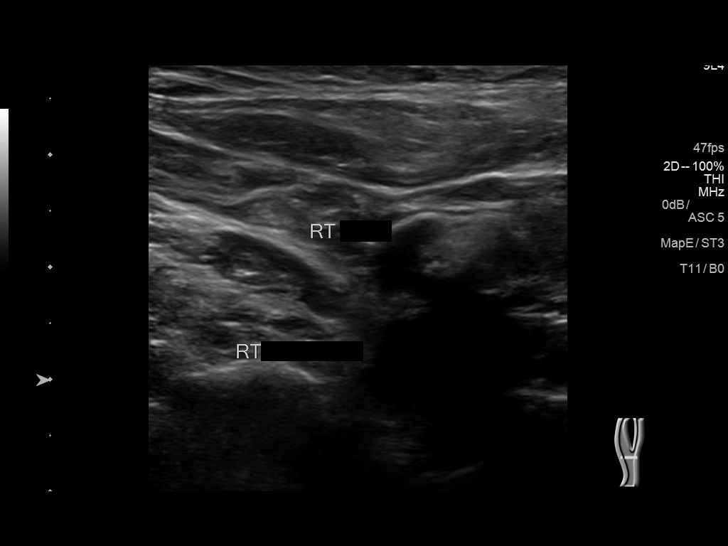
[im 19/68]
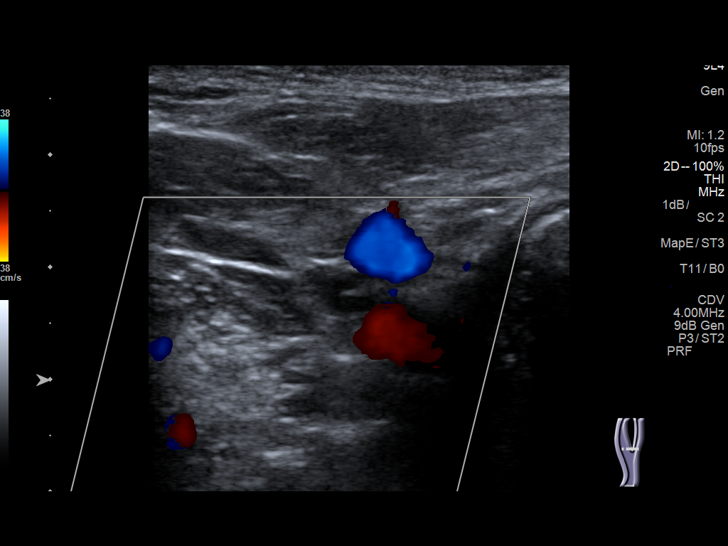
[im 25/68]
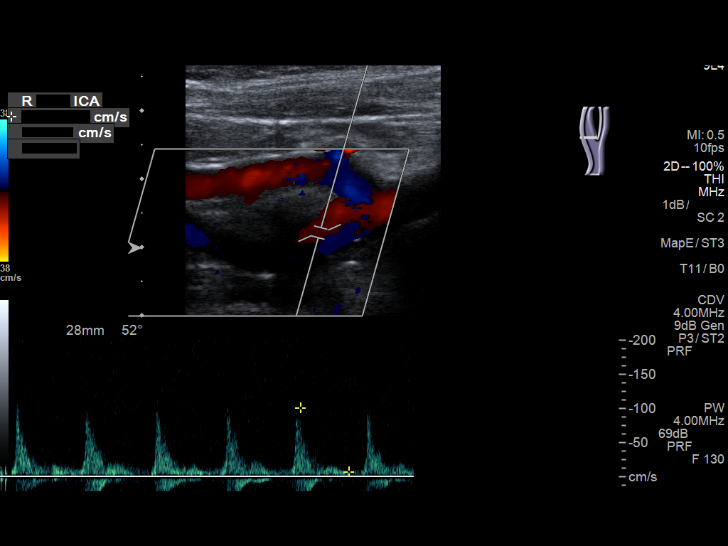
[im 31/68]
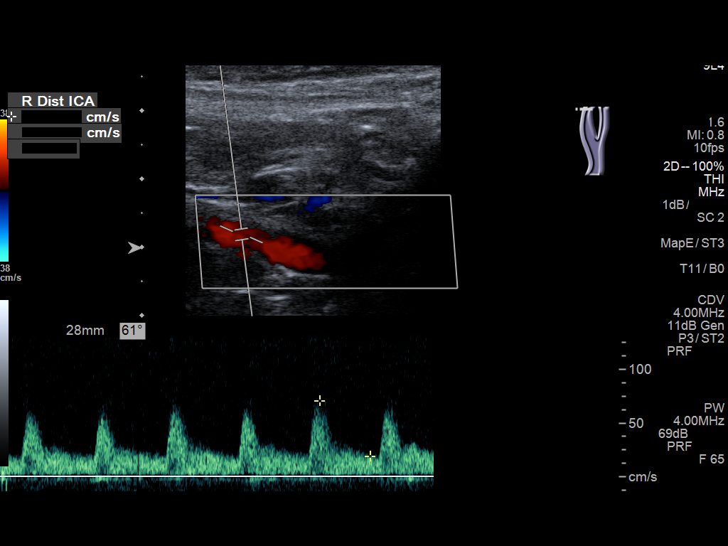
[im 37/68]
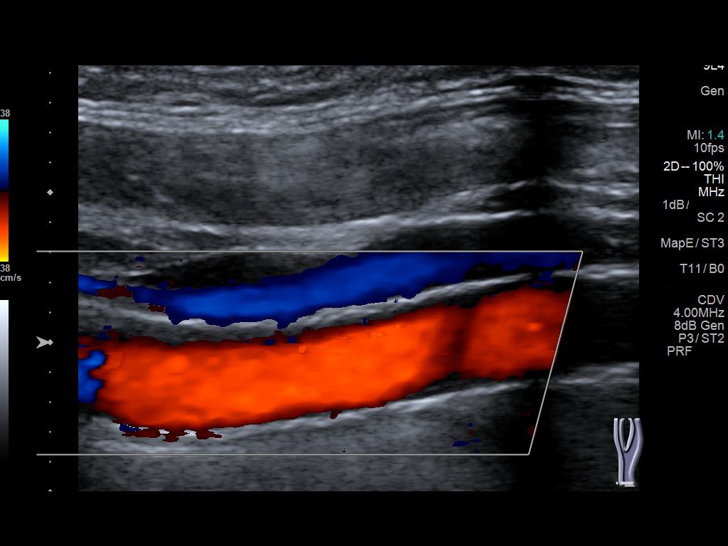
[im 43/68]
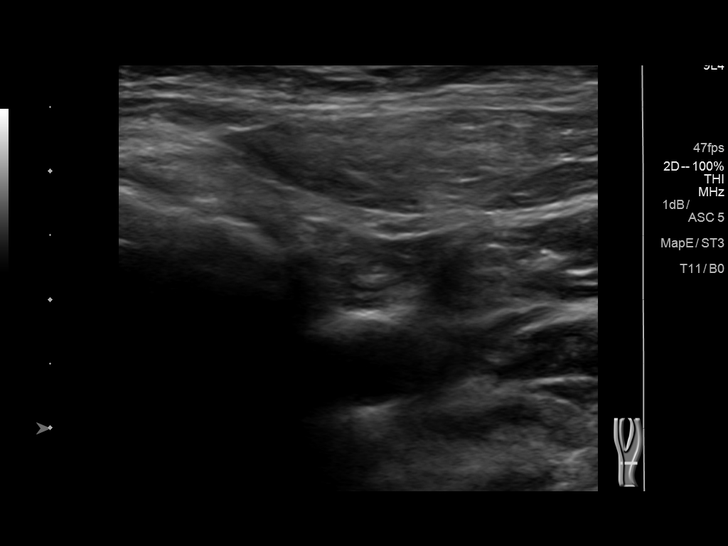
[im 49/68]
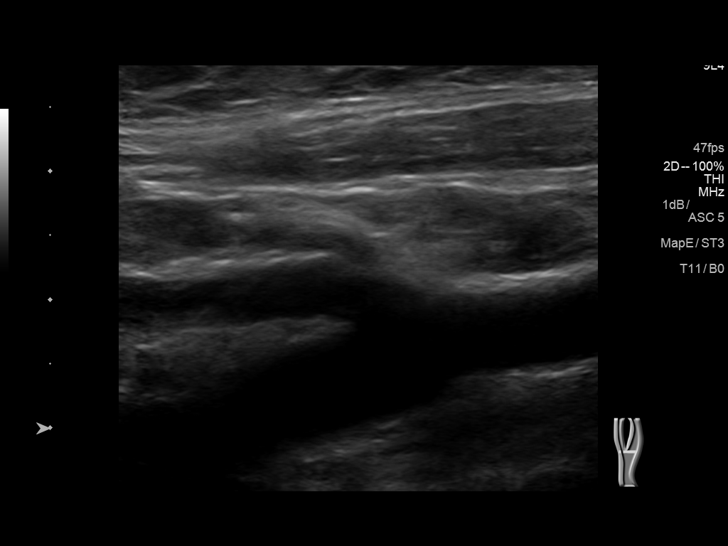
[im 55/68]
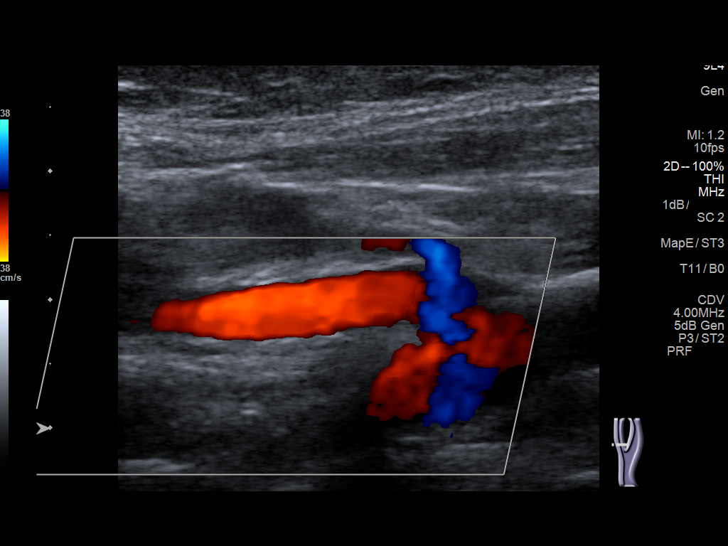
[im 61/68]
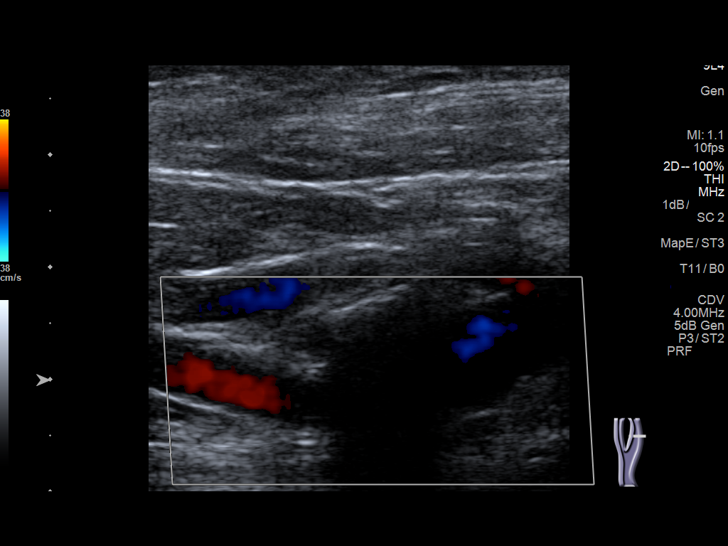
[im 68/68]
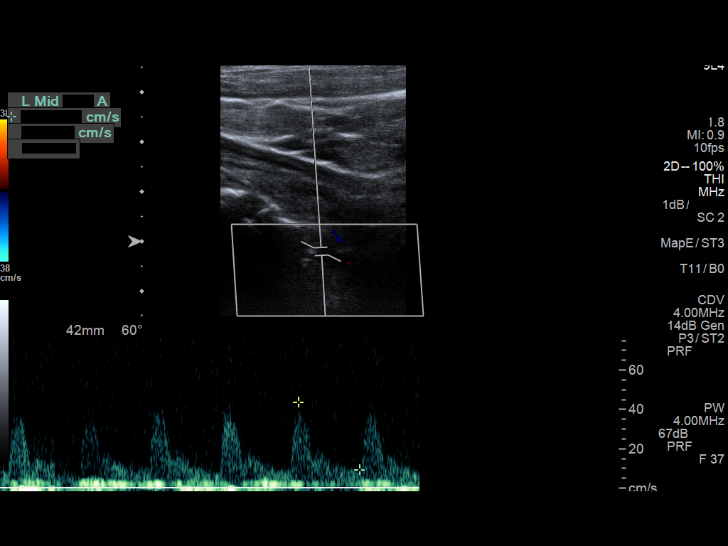

[13 of 25 positions shown; findings below may reference images not displayed]

ACR Breast Density Category b: There are scattered areas of
fibroglandular density.
FINDINGS: No suspicious masses or calcifications are seen in the right breast.
The oval circumscribed mass in the upper right breast appears less
conspicuous and slightly smaller when compared to the prior exam.

Mammographic images were processed with CAD.

Targeted ultrasound of the outer right breast was performed
demonstrating an oval circumscribed hypoechoic mass at 11 o'clock 8
cm from nipple measuring 0.6 x 0.2 x 0.6 cm, unchanged in size in
appearance given differences in imaging and measurement technique.
The outer right breast was scanned with no suspicious abnormality
seen.
IMPRESSION: Overall stable appearance of benign-appearing right breast mass.
There is no mammographic evidence of malignancy in the right breast.

RECOMMENDATION:
Diagnostic mammography of the bilateral breasts in 6 months with
possible right breast ultrasound.

I have discussed the findings and recommendations with the patient.
Results were also provided in writing at the conclusion of the
visit. If applicable, a reminder letter will be sent to the patient
regarding the next appointment.

BI-RADS CATEGORY  3: Probably benign.

## 2018-12-09 ENCOUNTER — Other Ambulatory Visit (HOSPITAL_COMMUNITY): Payer: Self-pay | Admitting: Adult Health

## 2018-12-09 DIAGNOSIS — Z1231 Encounter for screening mammogram for malignant neoplasm of breast: Secondary | ICD-10-CM

## 2018-12-31 DIAGNOSIS — D649 Anemia, unspecified: Secondary | ICD-10-CM | POA: Diagnosis not present

## 2018-12-31 DIAGNOSIS — F411 Generalized anxiety disorder: Secondary | ICD-10-CM | POA: Diagnosis not present

## 2018-12-31 DIAGNOSIS — M25562 Pain in left knee: Secondary | ICD-10-CM | POA: Diagnosis not present

## 2018-12-31 DIAGNOSIS — M79604 Pain in right leg: Secondary | ICD-10-CM | POA: Diagnosis not present

## 2018-12-31 DIAGNOSIS — Z6841 Body Mass Index (BMI) 40.0 and over, adult: Secondary | ICD-10-CM | POA: Diagnosis not present

## 2019-01-18 ENCOUNTER — Other Ambulatory Visit: Payer: Self-pay

## 2019-01-18 ENCOUNTER — Ambulatory Visit (HOSPITAL_COMMUNITY)
Admission: RE | Admit: 2019-01-18 | Discharge: 2019-01-18 | Disposition: A | Discharge status: Home / Self Care | Payer: BC Managed Care – PPO | Source: Ambulatory Visit | Attending: Adult Health | Admitting: Adult Health

## 2019-01-18 DIAGNOSIS — Z1231 Encounter for screening mammogram for malignant neoplasm of breast: Secondary | ICD-10-CM | POA: Diagnosis not present

## 2019-05-02 ENCOUNTER — Other Ambulatory Visit: Payer: Self-pay | Admitting: Adult Health

## 2019-05-20 ENCOUNTER — Telehealth: Payer: Self-pay | Admitting: *Deleted

## 2019-05-20 NOTE — Telephone Encounter (Signed)
Pt called requesting that [facility] refill her megace.

## 2019-05-26 ENCOUNTER — Telehealth: Payer: Self-pay | Admitting: Adult Health

## 2019-05-26 NOTE — Telephone Encounter (Signed)

## 2019-05-28 ENCOUNTER — Encounter: Payer: Self-pay | Admitting: Adult Health

## 2019-05-28 ENCOUNTER — Ambulatory Visit (INDEPENDENT_AMBULATORY_CARE_PROVIDER_SITE_OTHER): Payer: 59 | Admitting: Adult Health

## 2019-05-28 ENCOUNTER — Other Ambulatory Visit: Payer: Self-pay

## 2019-05-28 VITALS — BP 139/95 | HR 104 | Ht 60.0 in | Wt 377.0 lb

## 2019-05-28 DIAGNOSIS — N938 Other specified abnormal uterine and vaginal bleeding: Secondary | ICD-10-CM

## 2019-05-28 MED ORDER — MEDROXYPROGESTERONE ACETATE 10 MG PO TABS: ORAL_TABLET | ORAL | 4 refills | Status: DC

## 2019-05-28 MED ORDER — MEGESTROL ACETATE 40 MG PO TABS: ORAL_TABLET | ORAL | 3 refills | Status: DC

## 2019-05-28 NOTE — Progress Notes (Signed)
  Subjective:     Patient ID: Cindy Nixon, female   DOB: Feb 16, 1966, 54 y.o.   MRN: [id number]  HPI Cindy Nixon is a 54 year old black female, married, in to get refills on provera and megace,still bleeds after provera. Had gained weight to over 400 lbs but is losing with adipex. husband doing well after AKA,but not walking yet. Daughter is doing well since brain surgery, but did have MVA 2020. PCP is [facility].  Review of Systems Is taking provera 10 mg for 10 days every month and will have period, sometimes short and light and then may be heavier and longer then will take megace  Reviewed past medical,surgical, social and family history. Reviewed medications and allergies.     Objective:   Physical Exam BP (!) 139/95 (BP Location: Left Wrist, Patient Position: Sitting, Cuff Size: Normal)   Pulse (!) 104   Ht 5' (1.524 m)   Wt (!) 377 lb (171 kg)   BMI 73.63 kg/m Fall risk is low PHQ 2 score is 0 Skin warm and dry. Lungs: clear to ausculation bilaterally. Cardiovascular: regular rate and rhythm.   Discussed with Dr Elonda Husky, no [place] needed continue provera every month for 10 days.  Assessment:     1. DUB (dysfunctional uterine bleeding) Will continue provera 10 days every month, and will refill megace to stop bleeding if goes on too long Meds ordered this encounter  Medications  . medroxyPROGESTERone (PROVERA) 10 MG tablet    Sig: Take 1 daily for first 10 days of every month    Dispense:  30 tablet    Refill:  4    Order Specific Question:   Supervising Provider    Answer:   Elonda Husky, LUTHER H [[id number]]  . megestrol (MEGACE) 40 MG tablet    Sig: TAKE 3 TABLETS BY MOUTH DAILY FOR 5 DAYS THEN TAKE 2 TABLETS DAILY FOR 5 DAYS THEN 1 TABLET BY MOUTH DAILY    Dispense:  45 tablet    Refill:  3    Order Specific Question:   Supervising Provider    Answer:   Florian Buff [[id number]]      Plan:     Follow up in 3 months for pap and physical

## 2019-08-26 ENCOUNTER — Telehealth: Payer: Self-pay | Admitting: Adult Health

## 2019-08-26 NOTE — Telephone Encounter (Signed)

## 2019-08-27 ENCOUNTER — Other Ambulatory Visit (HOSPITAL_COMMUNITY)
Admission: RE | Admit: 2019-08-27 | Discharge: 2019-08-27 | Disposition: A | Discharge status: Home / Self Care | Payer: 59 | Source: Ambulatory Visit | Attending: Adult Health | Admitting: Adult Health

## 2019-08-27 ENCOUNTER — Encounter: Payer: Self-pay | Admitting: Adult Health

## 2019-08-27 ENCOUNTER — Ambulatory Visit (INDEPENDENT_AMBULATORY_CARE_PROVIDER_SITE_OTHER): Payer: 59 | Admitting: Adult Health

## 2019-08-27 VITALS — BP 151/84 | HR 103 | Ht 60.0 in | Wt 370.0 lb

## 2019-08-27 DIAGNOSIS — N938 Other specified abnormal uterine and vaginal bleeding: Secondary | ICD-10-CM | POA: Diagnosis not present

## 2019-08-27 DIAGNOSIS — Z01419 Encounter for gynecological examination (general) (routine) without abnormal findings: Secondary | ICD-10-CM | POA: Diagnosis not present

## 2019-08-27 DIAGNOSIS — Z1212 Encounter for screening for malignant neoplasm of rectum: Secondary | ICD-10-CM | POA: Diagnosis not present

## 2019-08-27 DIAGNOSIS — Z1211 Encounter for screening for malignant neoplasm of colon: Secondary | ICD-10-CM | POA: Insufficient documentation

## 2019-08-27 LAB — HEMOCCULT GUIAC POC 1CARD (OFFICE): Fecal Occult Blood, POC: NEGATIVE

## 2019-08-27 NOTE — Progress Notes (Signed)
Patient ID: Cindy Nixon, female   DOB: [birth year], 54 y.o.   MRN: [id number] History of Present Illness: Cindy Nixon is a 54 year old black female, married, G1P1, on provera 10 days every month. Period in May 1/2 a day, none for June yet.  PCP is [facility].    Current Medications, Allergies, Past Medical History, Past Surgical History, Family History and Social History were reviewed in [employer] record.     Review of Systems: Patient denies any headaches, hearing loss, fatigue, blurred vision, shortness of breath, chest pain, abdominal pain, problems with bowel movements, urination, or intercourse(no sex). No joint pain or mood swings. Had 1/2 period in May after provera    Physical Exam:BP (!) 151/84 (BP Location: Left Arm, Patient Position: Sitting, Cuff Size: Large)   Pulse (!) 103   Ht 5' (1.524 m)   Wt (!) 370 lb (167.8 kg)   BMI 72.26 kg/m  has lost 7 lbs since last visit in March. General:  Well developed, well nourished, no acute distress Skin:  Warm and dry Neck:  Midline trachea, normal thyroid, good ROM, no lymphadenopathy Lungs; Clear to auscultation bilaterally Breast:  No dominant palpable mass, retraction, or nipple discharge,large Cardiovascular: Regular rate and rhythm Abdomen:  Soft, non tender, no hepatosplenomegaly,obese Pelvic:  External genitalia is normal in appearance, no lesions.  The vagina is normal in appearance. Urethra has no lesions or masses. The cervix is bulbous, poorly seen pap with high risk HVOP 16/18 genotyping performed.Marland Kitchen  Uterus is felt to be normal size, shape, and contour, difficult secondary to abdominal girth.  No adnexal masses or tenderness noted.Bladder is non tender, no masses felt. Rectal: Good sphincter tone, no polyps, or hemorrhoids felt.  Hemoccult negative. Extremities/musculoskeletal:  No swelling or varicosities noted, no clubbing or cyanosis Psych:  No mood changes, alert and cooperative,seems happy AA  0 Fall risk is low PHQ 9 score is 2. Examination chaperoned by Dwyane Dee LPN.  Impression and Plan: 1. Encounter for gynecological examination with Papanicolaou smear of cervix Pap sent Physical in 1 year Pap in 3 if normal Mammogram yearly Labs with PCP  2. Screening for colorectal cancer Colonoscopy advised  3. DUB (dysfunctional uterine bleeding) Continue provera 10 mg first 10 days of every month, has refills

## 2019-08-31 LAB — CYTOLOGY - PAP
Comment: NEGATIVE
Diagnosis: NEGATIVE
High risk HPV: NEGATIVE

## 2020-01-11 ENCOUNTER — Other Ambulatory Visit (HOSPITAL_COMMUNITY): Payer: Self-pay | Admitting: Adult Health

## 2020-01-11 DIAGNOSIS — Z1231 Encounter for screening mammogram for malignant neoplasm of breast: Secondary | ICD-10-CM

## 2020-01-24 ENCOUNTER — Other Ambulatory Visit: Payer: Self-pay

## 2020-01-24 ENCOUNTER — Ambulatory Visit (HOSPITAL_COMMUNITY)
Admission: RE | Admit: 2020-01-24 | Discharge: 2020-01-24 | Disposition: A | Discharge status: Home / Self Care | Payer: 59 | Source: Ambulatory Visit | Attending: Adult Health | Admitting: Adult Health

## 2020-01-24 DIAGNOSIS — Z1231 Encounter for screening mammogram for malignant neoplasm of breast: Secondary | ICD-10-CM | POA: Insufficient documentation

## 2020-06-24 ENCOUNTER — Other Ambulatory Visit: Payer: Self-pay | Admitting: Adult Health

## 2020-12-27 ENCOUNTER — Other Ambulatory Visit (HOSPITAL_COMMUNITY): Payer: Self-pay | Admitting: Adult Health

## 2020-12-27 DIAGNOSIS — Z1231 Encounter for screening mammogram for malignant neoplasm of breast: Secondary | ICD-10-CM

## 2021-01-26 ENCOUNTER — Other Ambulatory Visit: Payer: Self-pay

## 2021-01-26 ENCOUNTER — Ambulatory Visit (HOSPITAL_COMMUNITY)
Admission: RE | Admit: 2021-01-26 | Discharge: 2021-01-26 | Disposition: A | Discharge status: Home / Self Care | Payer: 59 | Source: Ambulatory Visit | Attending: Adult Health | Admitting: Adult Health

## 2021-01-26 DIAGNOSIS — Z1231 Encounter for screening mammogram for malignant neoplasm of breast: Secondary | ICD-10-CM | POA: Diagnosis present

## 2021-01-29 ENCOUNTER — Other Ambulatory Visit (HOSPITAL_COMMUNITY): Payer: Self-pay | Admitting: Adult Health

## 2021-01-29 DIAGNOSIS — R928 Other abnormal and inconclusive findings on diagnostic imaging of breast: Secondary | ICD-10-CM

## 2021-02-15 ENCOUNTER — Ambulatory Visit (HOSPITAL_COMMUNITY)
Admission: RE | Admit: 2021-02-15 | Discharge: 2021-02-15 | Disposition: A | Discharge status: Home / Self Care | Payer: 59 | Source: Ambulatory Visit | Attending: Adult Health | Admitting: Adult Health

## 2021-02-15 ENCOUNTER — Other Ambulatory Visit: Payer: Self-pay

## 2021-02-15 DIAGNOSIS — R928 Other abnormal and inconclusive findings on diagnostic imaging of breast: Secondary | ICD-10-CM | POA: Insufficient documentation

## 2021-05-06 ENCOUNTER — Ambulatory Visit (HOSPITAL_COMMUNITY)
Admission: EM | Admit: 2021-05-06 | Discharge: 2021-05-06 | Disposition: A | Discharge status: Home / Self Care | Payer: 59 | Attending: Physician Assistant | Admitting: Physician Assistant

## 2021-05-06 ENCOUNTER — Other Ambulatory Visit: Payer: Self-pay

## 2021-05-06 DIAGNOSIS — J029 Acute pharyngitis, unspecified: Secondary | ICD-10-CM | POA: Diagnosis not present

## 2021-05-06 LAB — POCT RAPID STREP A, ED / UC: Streptococcus, Group A Screen (Direct): NEGATIVE

## 2021-05-06 MED ORDER — AMOXICILLIN 500 MG PO CAPS: 500.0000 mg | ORAL_CAPSULE | Freq: Two times a day (BID) | ORAL | 0 refills | Status: AC

## 2021-05-06 NOTE — ED Provider Notes (Signed)
[place]    CSN: [id number] Arrival date & time: 05/06/21  1613      History   Chief Complaint Chief Complaint  Patient presents with   Sore Throat    Fever     HPI Cindy Nixon is a 56 y.o. female.   Patient presenting to office with her niece.  Reports 3-day history of congestion, ear pain, sore throat, and fatigue.  Patient reports her granddaughter recently tested positive for strep throat.  She reports she sleeps in the same bed as her granddaughter.  Patient denies fevers, bodyaches, chills, wheezing, shortness of breath, chest pain, headache, abdominal pain, nausea vomiting or diarrhea, any new rash.     Past Medical History:  Diagnosis Date   Anemia    GERD (gastroesophageal reflux disease)    Seasonal allergies     Patient Active Problem List   Diagnosis Date Noted   Encounter for gynecological examination with Papanicolaou smear of cervix 08/27/2019   Screening for colorectal cancer 08/27/2019   DUB (dysfunctional uterine bleeding) 06/13/2017   Reactive depression 10/30/2016   PMB (postmenopausal bleeding) 10/30/2016   Morbid obesity ([place]) 07/17/2012    Past Surgical History:  Procedure Laterality Date   BREAST BIOPSY Left 08/13/2012   Procedure: NEEDLE LOCALIZATION left breast mass removal;  Surgeon: Haywood Lasso, MD;  Location: [facility];  Service: General;  Laterality: Left;   CHOLECYSTECTOMY  08/1991    OB History     Gravida  1   Para  1   Term  1   Preterm      AB      Living  1      SAB      IAB      Ectopic      Multiple      Live Births  1            Home Medications    Prior to Admission medications   Medication Sig Start Date End Date Taking? Authorizing Provider  amoxicillin (AMOXIL) 500 MG capsule Take 1 capsule (500 mg total) by mouth 2 (two) times daily for 10 days. 05/06/21 05/16/21 Yes Eulogio Bear, NP  ALPRAZolam Duanne Moron) 1 MG tablet TAKE 1 TABLET BY MOUTH TWICE DAILY AS NEEDED FOR  ANXIETY 03/14/17   Florian Buff, MD  Ascorbic Acid (VITAMIN C) 100 MG tablet Take 100 mg by mouth daily.    [provider]  medroxyPROGESTERone (PROVERA) 10 MG tablet TAKE 1 TABLET BY MOUTH EVERY DAY FOR FIRST 10 DAYS OF EVERY MONTH 06/26/20   Estill Dooms, NP  megestrol (MEGACE) 40 MG tablet TAKE 3 TABLETS BY MOUTH DAILY FOR 5 DAYS THEN TAKE 2 TABLETS DAILY FOR 5 DAYS THEN 1 TABLET BY MOUTH DAILY 05/28/19   Estill Dooms, NP  Multiple Vitamin (MULTIVITAMIN WITH MINERALS) TABS Take 1 tablet by mouth daily.    [provider]  naproxen (NAPROSYN) 500 MG tablet TK 1 T PO BID FOR 1 WEEK THEN PRN 06/09/17   [provider]  omeprazole (PRILOSEC) 10 MG capsule Take 10 mg by mouth daily.    [provider]  oxyCODONE-acetaminophen (PERCOCET/ROXICET) 5-325 MG tablet Take 1 tablet by mouth daily as needed. 12/31/18   [provider]  phentermine (ADIPEX-P) 37.5 MG tablet Take 37.5 mg by mouth daily. 12/31/18   [provider]  POTASSIUM PO Take 1 tablet by mouth daily.    [provider]    [facility]  History Family History  Problem Relation Age of Onset   Cancer Mother        ovarian   Breast cancer Paternal Aunt    Breast cancer Paternal Aunt    Breast cancer Paternal Aunt    COPD Father    COPD Brother    Other Daughter        cysts in both breast; had partial mastectomy both breasts   Diabetes Daughter    Ovarian cysts Daughter     Social History Social History   Tobacco Use   Smoking status: Never   Smokeless tobacco: Never  Vaping Use   Vaping Use: Never used  Substance Use Topics   Alcohol use: No   Drug use: No     Allergies   Patient has no known allergies.   Review of Systems Review of Systems Per HPI  Physical Exam Triage Vital Signs ED Triage Vitals  Enc Vitals Group     BP 05/06/21 1801 137/86     Pulse Rate 05/06/21 1719 92     Resp 05/06/21 1719 18     Temp --      Temp Source  05/06/21 1719 Oral     SpO2 05/06/21 1719 100 %     Weight --      Height --      Head Circumference --      Peak Flow --      Pain Score 05/06/21 1719 0     Pain Loc --      Pain Edu? --      Excl. in [place]? --    No data found.  Updated Vital Signs BP 137/86 (BP Location: Left Arm)    Pulse 92    Resp 18    SpO2 100%   Visual Acuity Right Eye Distance:   Left Eye Distance:   Bilateral Distance:    Right Eye Near:   Left Eye Near:    Bilateral Near:     Physical Exam Vitals and nursing note reviewed.  HENT:     Head: Normocephalic and atraumatic.     Right Ear: Tympanic membrane, ear canal and external ear normal.     Left Ear: Tympanic membrane, ear canal and external ear normal.     Nose: Nose normal. No congestion or rhinorrhea.     Mouth/Throat:     Mouth: Mucous membranes are dry.     Pharynx: Posterior oropharyngeal erythema present. No oropharyngeal exudate.     Tonsils: Tonsillar exudate present. No tonsillar abscesses. 2+ on the right. 2+ on the left.  Eyes:     General: No scleral icterus.    Extraocular Movements: Extraocular movements intact.  Cardiovascular:     Rate and Rhythm: Normal rate and regular rhythm.  Pulmonary:     Effort: Pulmonary effort is normal. No respiratory distress.     Breath sounds: Normal breath sounds. No wheezing, rhonchi or rales.  Musculoskeletal:     Cervical back: Normal range of motion.  Skin:    General: Skin is warm and dry.     Coloration: Skin is not jaundiced or pale.     Findings: No erythema.  Neurological:     Mental Status: She is oriented to person, place, and time.     Gait: Gait abnormal (patient sitting in wheelchair).  Psychiatric:        Mood and Affect: Mood normal.        Behavior: Behavior normal.  Thought Content: Thought content normal.        Judgment: Judgment normal.     UC Treatments / Results  Labs (all labs ordered are listed, but only abnormal results are displayed) Labs Reviewed   CULTURE, GROUP A STREP [facility])  POCT RAPID STREP A, ED / UC    EKG   Radiology No results found.  Procedures Procedures (including critical care time)  Medications Ordered in UC Medications - No data to display  Initial Impression / Assessment and Plan / UC Course  I have reviewed the triage vital signs and the nursing notes.  Pertinent labs & imaging results that were available during my care of the patient were reviewed by me and considered in my medical decision making (see chart for details).    Symptoms are consistent with acute strep pharyngitis vs. Acute upper respiratory viral illness.  Rapid strep today is negative.  Send for throat culture.  Given close proximity to known positive exposure, will treat the patient since she is having symptoms.  Start amoxicillin 500 mg twice daily for 10 days.  Follow up with PCP if symptoms worsen or persist despite treatment. Final Clinical Impressions(s) / UC Diagnoses   Final diagnoses:  Acute pharyngitis, unspecified etiology     Discharge Instructions      Please complete the entire course of amoxicillin as prescribed.  If your symptoms persist despite treatment, please contact your primary care provider or return to urgent care.    ED Prescriptions     Medication Sig Dispense Auth. Provider   amoxicillin (AMOXIL) 500 MG capsule Take 1 capsule (500 mg total) by mouth 2 (two) times daily for 10 days. 20 capsule Eulogio Bear, NP      PDMP not reviewed this encounter.   Eulogio Bear, NP 05/06/21 1929

## 2021-05-06 NOTE — ED Triage Notes (Signed)
Pt c/o sore throat and fever for several days. Grandchild has strep and she would like to be tested.

## 2021-05-06 NOTE — Discharge Instructions (Signed)
Please complete the entire course of amoxicillin as prescribed.  If your symptoms persist despite treatment, please contact your primary care provider or return to urgent care.

## 2021-05-09 LAB — CULTURE, GROUP A STREP (THRC)

## 2021-06-18 DIAGNOSIS — Z6841 Body Mass Index (BMI) 40.0 and over, adult: Secondary | ICD-10-CM | POA: Diagnosis not present

## 2021-06-18 DIAGNOSIS — M25562 Pain in left knee: Secondary | ICD-10-CM | POA: Diagnosis not present

## 2021-06-18 DIAGNOSIS — E782 Mixed hyperlipidemia: Secondary | ICD-10-CM | POA: Diagnosis not present

## 2021-06-18 DIAGNOSIS — E7849 Other hyperlipidemia: Secondary | ICD-10-CM | POA: Diagnosis not present

## 2021-06-18 DIAGNOSIS — R69 Illness, unspecified: Secondary | ICD-10-CM | POA: Diagnosis not present

## 2021-06-18 DIAGNOSIS — E1165 Type 2 diabetes mellitus with hyperglycemia: Secondary | ICD-10-CM | POA: Diagnosis not present

## 2021-06-18 DIAGNOSIS — I10 Essential (primary) hypertension: Secondary | ICD-10-CM | POA: Diagnosis not present

## 2021-06-25 ENCOUNTER — Telehealth: Payer: Self-pay | Admitting: Women's Health

## 2021-06-25 NOTE — Telephone Encounter (Signed)
Patient is calling asking for a refill for megestrol sent to [facility] [address] states had to switch pharmacy due to insurance   ?

## 2021-06-26 ENCOUNTER — Other Ambulatory Visit: Payer: Self-pay | Admitting: Women's Health

## 2021-06-26 ENCOUNTER — Other Ambulatory Visit: Payer: Self-pay | Admitting: Adult Health

## 2021-06-26 ENCOUNTER — Telehealth: Payer: Self-pay | Admitting: *Deleted

## 2021-06-26 ENCOUNTER — Telehealth: Payer: Self-pay | Admitting: Adult Health

## 2021-06-26 MED ORDER — MEGESTROL ACETATE 40 MG PO TABS: ORAL_TABLET | ORAL | 0 refills | Status: DC

## 2021-06-26 NOTE — Telephone Encounter (Signed)
Pt is requesting a refill on Megace. Thanks!! JSY ?

## 2021-07-13 ENCOUNTER — Other Ambulatory Visit: Payer: Self-pay | Admitting: Women's Health

## 2021-07-26 ENCOUNTER — Ambulatory Visit (INDEPENDENT_AMBULATORY_CARE_PROVIDER_SITE_OTHER): Payer: 59 | Admitting: Adult Health

## 2021-07-26 ENCOUNTER — Encounter: Payer: Self-pay | Admitting: Adult Health

## 2021-07-26 VITALS — BP 133/89 | HR 78 | Ht 60.0 in | Wt 343.4 lb

## 2021-07-26 DIAGNOSIS — Z1211 Encounter for screening for malignant neoplasm of colon: Secondary | ICD-10-CM | POA: Diagnosis not present

## 2021-07-26 DIAGNOSIS — N938 Other specified abnormal uterine and vaginal bleeding: Secondary | ICD-10-CM

## 2021-07-26 DIAGNOSIS — Z01419 Encounter for gynecological examination (general) (routine) without abnormal findings: Secondary | ICD-10-CM | POA: Diagnosis not present

## 2021-07-26 LAB — HEMOCCULT GUIAC POC 1CARD (OFFICE): Fecal Occult Blood, POC: NEGATIVE

## 2021-07-26 MED ORDER — MEDROXYPROGESTERONE ACETATE 10 MG PO TABS: ORAL_TABLET | ORAL | 4 refills | Status: DC

## 2021-07-26 MED ORDER — MEGESTROL ACETATE 40 MG PO TABS: ORAL_TABLET | ORAL | 3 refills | Status: DC

## 2021-07-26 NOTE — Progress Notes (Signed)
Patient ID: Cindy Nixon, female   DOB: 06-10-1965, 56 y.o.   MRN: [id number] History of Present Illness: Cindy Nixon is a 56 year old black female, married, PM in for well woman gyn exam. She is still bleeding with cyclic provera, can be light or heavy, has megace if needed. Lab Results  Component Value Date   DIAGPAP  08/27/2019    - Negative for intraepithelial lesion or malignancy (NILM)   [place] Negative 08/27/2019    PCP is Lanelle Bal PA.  Current Medications, Allergies, Past Medical History, Past Surgical History, Family History and Social History were reviewed in [employer] record.     Review of Systems: Patient denies any headaches, hearing loss, fatigue, blurred vision, shortness of breath, chest pain, abdominal pain, problems with bowel movements, urination, or intercourse(not active). No joint pain or mood swings.     Physical Exam:BP 133/89 (BP Location: Right Arm, Patient Position: Sitting, Cuff Size: Normal)   Pulse 78   Ht 5' (1.524 m)   Wt (!) 343 lb 6.4 oz (155.8 kg)   LMP 07/09/2021   BMI 67.07 kg/m   General:  Well developed, well nourished, no acute distress Skin:  Warm and dry Neck:  Midline trachea, normal thyroid, good ROM, no lymphadenopathy Lungs; Clear to auscultation bilaterally Breast:  No dominant palpable mass, retraction, or nipple discharge,has large breasts Cardiovascular: Regular rate and rhythm Abdomen:  Soft, non tender, no hepatosplenomegaly,obese Pelvic:  External genitalia is normal in appearance, no lesions.  The vagina is normal in appearance. +blood. Urethra has no lesions or masses. The cervix is bulbous.  Uterus is felt to be normal size, shape, and contour.  No adnexal masses or tenderness noted.Bladder is non tender, no masses felt. Rectal: Good sphincter tone, no polyps, or hemorrhoids felt.  Hemoccult negative. Extremities/musculoskeletal:  No swelling or varicosities noted, no clubbing or cyanosis Psych:  No  mood changes, alert and cooperative,seems happy AA is 0 Fall risk is low    07/26/2021    3:44 PM 08/27/2019   11:14 AM 05/28/2019   11:56 AM  Depression screen PHQ 2/9  Decreased Interest 2 0 0  Down, Depressed, Hopeless 0 1 0  PHQ - 2 Score 2 1 0  Altered sleeping 0 0   Tired, decreased energy 0 1   Change in appetite 0 0   Feeling bad or failure about yourself  0 0   Trouble concentrating 0 0   Moving slowly or fidgety/restless 0 0   Suicidal thoughts 0 0   PHQ-9 Score 2 2   Difficult doing work/chores  Not difficult at all        07/26/2021    3:44 PM 08/27/2019   11:14 AM  GAD 7 : Generalized Anxiety Score  Nervous, Anxious, on Edge 0 1  Control/stop worrying 0 0  Worry too much - different things 0 0  Trouble relaxing 0 0  Restless 0 0  Easily annoyed or irritable 0 0  Afraid - awful might happen 0 0  Total GAD 7 Score 0 1  Anxiety Difficulty  Not difficult at all    Upstream - 07/26/21 1554       Pregnancy Intention Screening   Does the patient want to become pregnant in the next year? No    Does the patient's partner want to become pregnant in the next year? No    Would the patient like to discuss contraceptive options today? No  Contraception Wrap Up   Current Method Abstinence    End Method Abstinence    Contraception Counseling Provided No              Examination chaperoned by Marcie Bal young LPN   Impression and Plan: 1. Encounter for well woman exam with routine gynecological exam Pap and physical in 1 year Mammogram yearly Labs with PCP  2. Encounter for screening fecal occult blood testing Hemoccult was negative   3. DUB (dysfunctional uterine bleeding) Will continue cyclic provera and megace  Meds ordered this encounter  Medications   medroxyPROGESTERone (PROVERA) 10 MG tablet    Sig: TAKE 1 TABLET BY MOUTH EVERY DAY FOR FIRST 10 DAYS OF EVERY MONTH    Dispense:  30 tablet    Refill:  4    Order Specific Question:   Supervising  Provider    Answer:   Cindy Nixon, Cindy Nixon [[id number]]   megestrol (MEGACE) 40 MG tablet    Sig: TAKE 3 TABLETS BY MOUTH DAILY FOR 5 DAYS THEN TAKE 2 TABLETS DAILY FOR 5 DAYS THEN 1 TABLET BY MOUTH DAILY    Dispense:  45 tablet    Refill:  3    Order Specific Question:   Supervising Provider    Answer:   Cindy Nixon [[id number]]

## 2021-11-24 ENCOUNTER — Other Ambulatory Visit: Payer: Self-pay | Admitting: Adult Health

## 2021-12-07 ENCOUNTER — Other Ambulatory Visit: Payer: Self-pay | Admitting: Adult Health

## 2021-12-12 DIAGNOSIS — Z23 Encounter for immunization: Secondary | ICD-10-CM | POA: Diagnosis not present

## 2021-12-12 DIAGNOSIS — Z6841 Body Mass Index (BMI) 40.0 and over, adult: Secondary | ICD-10-CM | POA: Diagnosis not present

## 2021-12-12 DIAGNOSIS — R030 Elevated blood-pressure reading, without diagnosis of hypertension: Secondary | ICD-10-CM | POA: Diagnosis not present

## 2021-12-12 DIAGNOSIS — R69 Illness, unspecified: Secondary | ICD-10-CM | POA: Diagnosis not present

## 2021-12-12 DIAGNOSIS — M25562 Pain in left knee: Secondary | ICD-10-CM | POA: Diagnosis not present

## 2021-12-12 DIAGNOSIS — E1165 Type 2 diabetes mellitus with hyperglycemia: Secondary | ICD-10-CM | POA: Diagnosis not present

## 2021-12-12 DIAGNOSIS — E7849 Other hyperlipidemia: Secondary | ICD-10-CM | POA: Diagnosis not present

## 2021-12-12 DIAGNOSIS — E782 Mixed hyperlipidemia: Secondary | ICD-10-CM | POA: Diagnosis not present

## 2022-01-02 ENCOUNTER — Other Ambulatory Visit: Payer: Self-pay | Admitting: Adult Health

## 2022-01-14 ENCOUNTER — Other Ambulatory Visit (HOSPITAL_COMMUNITY): Payer: Self-pay | Admitting: Adult Health

## 2022-01-14 DIAGNOSIS — Z1231 Encounter for screening mammogram for malignant neoplasm of breast: Secondary | ICD-10-CM

## 2022-01-23 ENCOUNTER — Other Ambulatory Visit: Payer: Self-pay | Admitting: Adult Health

## 2022-01-28 ENCOUNTER — Ambulatory Visit (HOSPITAL_COMMUNITY)
Admission: RE | Admit: 2022-01-28 | Discharge: 2022-01-28 | Disposition: A | Discharge status: Home / Self Care | Payer: 59 | Source: Ambulatory Visit | Attending: Adult Health | Admitting: Adult Health

## 2022-01-28 DIAGNOSIS — Z1231 Encounter for screening mammogram for malignant neoplasm of breast: Secondary | ICD-10-CM | POA: Insufficient documentation

## 2022-06-10 DIAGNOSIS — Z419 Encounter for procedure for purposes other than remedying health state, unspecified: Secondary | ICD-10-CM | POA: Diagnosis not present

## 2022-07-10 DIAGNOSIS — Z419 Encounter for procedure for purposes other than remedying health state, unspecified: Secondary | ICD-10-CM | POA: Diagnosis not present

## 2022-07-18 DIAGNOSIS — F411 Generalized anxiety disorder: Secondary | ICD-10-CM | POA: Diagnosis not present

## 2022-07-18 DIAGNOSIS — R030 Elevated blood-pressure reading, without diagnosis of hypertension: Secondary | ICD-10-CM | POA: Diagnosis not present

## 2022-07-18 DIAGNOSIS — H6121 Impacted cerumen, right ear: Secondary | ICD-10-CM | POA: Diagnosis not present

## 2022-07-18 DIAGNOSIS — E782 Mixed hyperlipidemia: Secondary | ICD-10-CM | POA: Diagnosis not present

## 2022-07-18 DIAGNOSIS — M25562 Pain in left knee: Secondary | ICD-10-CM | POA: Diagnosis not present

## 2022-07-18 DIAGNOSIS — E1165 Type 2 diabetes mellitus with hyperglycemia: Secondary | ICD-10-CM | POA: Diagnosis not present

## 2022-07-18 DIAGNOSIS — F419 Anxiety disorder, unspecified: Secondary | ICD-10-CM | POA: Diagnosis not present

## 2022-07-18 DIAGNOSIS — E7849 Other hyperlipidemia: Secondary | ICD-10-CM | POA: Diagnosis not present

## 2022-07-18 DIAGNOSIS — Z6841 Body Mass Index (BMI) 40.0 and over, adult: Secondary | ICD-10-CM | POA: Diagnosis not present

## 2022-08-12 ENCOUNTER — Other Ambulatory Visit: Payer: Self-pay | Admitting: Adult Health

## 2022-10-16 ENCOUNTER — Other Ambulatory Visit (HOSPITAL_COMMUNITY)
Admission: RE | Admit: 2022-10-16 | Discharge: 2022-10-16 | Disposition: A | Discharge status: Home / Self Care | Payer: 59 | Source: Ambulatory Visit | Attending: Adult Health | Admitting: Adult Health

## 2022-10-16 ENCOUNTER — Encounter: Payer: Self-pay | Admitting: Adult Health

## 2022-10-16 ENCOUNTER — Ambulatory Visit (INDEPENDENT_AMBULATORY_CARE_PROVIDER_SITE_OTHER): Payer: 59 | Admitting: Adult Health

## 2022-10-16 VITALS — BP 123/85 | HR 83 | Ht 60.0 in | Wt 356.2 lb

## 2022-10-16 DIAGNOSIS — N950 Postmenopausal bleeding: Secondary | ICD-10-CM

## 2022-10-16 DIAGNOSIS — Z01419 Encounter for gynecological examination (general) (routine) without abnormal findings: Secondary | ICD-10-CM | POA: Diagnosis not present

## 2022-10-16 DIAGNOSIS — N938 Other specified abnormal uterine and vaginal bleeding: Secondary | ICD-10-CM | POA: Diagnosis not present

## 2022-10-16 NOTE — Progress Notes (Signed)
Patient ID: [name], female   DOB: December 26, 1965, 57 y.o.   MRN: [id number] History of Present Illness: Cindy Nixon is a 57 year old black female,married, PM, in for a well woman gyn exam and pap. She is on cyclic provera 10 mg first 10 days of the month, and bleeding has gotten lighter, last full period in November, may bleed 1 day now. She had endometrial biopsy 2018 disordered proliferative endometrium. Bleeding started today.  PCP is Cindy Moris PA.    Current Medications, Allergies, Past Medical History, Past Surgical History, Family History and Social History were reviewed in [employer] record.     Review of Systems: Patient denies any headaches, hearing loss, fatigue, blurred vision, shortness of breath, chest pain, abdominal pain, problems with bowel movements, urination, or intercourse.(Not active). No  mood swings.  Has left knee pain.    Physical Exam:BP 123/85 (BP Location: Left Arm, Patient Position: Sitting, Cuff Size: Normal)   Pulse 83   Ht 5' (1.524 m)   Wt (!) 356 lb 3.2 oz (161.6 kg)   BMI 69.57 kg/m   General:  Well developed, well nourished, no acute distress Skin:  Warm and dry Neck:  Midline trachea, normal thyroid, good ROM, no lymphadenopathy Lungs; Clear to auscultation bilaterally Breast:  No dominant palpable mass, retraction, or nipple discharge, has large breasts Cardiovascular: Regular rate and rhythm Abdomen:  Soft, non tender, no hepatosplenomegaly,obese Pelvic:  External genitalia is normal in appearance, no lesions.  The vagina is normal in appearance,+blood. Urethra has no lesions or masses. The cervix is smooth, pap with HR HPV genotyping performed, poorly visualized. Uterus is felt to be normal size, shape, and contour, but difficult exam due to body habitus.  No adnexal masses or tenderness noted.Bladder is non tender, no masses felt. Rectal: Deferred Extremities/musculoskeletal:  No swelling or varicosities noted, no  clubbing or cyanosis Psych:  No mood changes, alert and cooperative,seems happy AA is 0 Fall risk is low    10/16/2022    1:42 PM 07/26/2021    3:44 PM 08/27/2019   11:14 AM  Depression screen PHQ 2/9  Decreased Interest 0 2 0  Down, Depressed, Hopeless 0 0 1  PHQ - 2 Score 0 2 1  Altered sleeping 0 0 0  Tired, decreased energy 1 0 1  Change in appetite 0 0 0  Feeling bad or failure about yourself  0 0 0  Trouble concentrating 0 0 0  Moving slowly or fidgety/restless 1 0 0  Suicidal thoughts 0 0 0  PHQ-9 Score 2 2 2   Difficult doing work/chores   Not difficult at all       10/16/2022    1:42 PM 07/26/2021    3:44 PM 08/27/2019   11:14 AM  GAD 7 : Generalized Anxiety Score  Nervous, Anxious, on Edge 1 0 1  Control/stop worrying 2 0 0  Worry too much - different things 2 0 0  Trouble relaxing 0 0 0  Restless 0 0 0  Easily annoyed or irritable 0 0 0  Afraid - awful might happen 2 0 0  Total GAD 7 Score 7 0 1  Anxiety Difficulty   Not difficult at all    Upstream - 10/16/22 1350       Pregnancy Intention Screening   Does the patient want to become pregnant in the next year? No    Does the patient's partner want to become pregnant in the next year? No  Would the patient like to discuss contraceptive options today? No      Contraception Wrap Up   Current Method Abstinence    End Method Abstinence            Examination chaperoned by Malachy Mood LPN     Impression and Plan: 1. Encounter for gynecological examination with Papanicolaou smear of cervix Pap sent Pap in 3 years if normal Physical in 1 year Labs with PCP Mammogram was normal 01/28/22. - Cytology - PAP( [place])  2. DUB (dysfunctional uterine bleeding) Has bleeding almost every month after provera, but not as heavy, has not taken megace in a long while  3. Morbid obesity ([facility])  4. PMB (postmenopausal bleeding) Still has bleeding on cyclic provera 10 mg for 10 days every month, she has refills   Return in 2 weeks for endometrial biopsy with Dr Charlotta Newton to make sure endometrium has not changed

## 2022-10-21 ENCOUNTER — Telehealth: Payer: Self-pay | Admitting: *Deleted

## 2022-10-21 NOTE — Telephone Encounter (Signed)
Pt aware pap was negative for HPV and malignancy. Pt voiced understanding. [place]

## 2022-10-21 NOTE — Telephone Encounter (Signed)
-----   Message from Cyril Mourning sent at 10/21/2022  9:46 AM EDT ----- Please let her know pap was negative HPV and malignancy, THX

## 2022-10-30 ENCOUNTER — Encounter: Payer: Self-pay | Admitting: Obstetrics & Gynecology

## 2022-10-30 ENCOUNTER — Ambulatory Visit: Payer: 59 | Admitting: Obstetrics & Gynecology

## 2022-10-30 ENCOUNTER — Other Ambulatory Visit (HOSPITAL_COMMUNITY)
Admission: RE | Admit: 2022-10-30 | Discharge: 2022-10-30 | Disposition: A | Discharge status: Home / Self Care | Payer: 59 | Source: Ambulatory Visit | Attending: Obstetrics & Gynecology | Admitting: Obstetrics & Gynecology

## 2022-10-30 VITALS — BP 139/87 | HR 75 | Ht 60.0 in

## 2022-10-30 DIAGNOSIS — N938 Other specified abnormal uterine and vaginal bleeding: Secondary | ICD-10-CM

## 2022-10-30 DIAGNOSIS — N939 Abnormal uterine and vaginal bleeding, unspecified: Secondary | ICD-10-CM | POA: Diagnosis not present

## 2022-10-30 DIAGNOSIS — N858 Other specified noninflammatory disorders of uterus: Secondary | ICD-10-CM | POA: Diagnosis not present

## 2022-10-30 NOTE — Progress Notes (Signed)
GYN VISIT Patient name: Cindy Nixon MRN [id number]  Date of birth: 03-19-1965 Chief Complaint:   Procedure  History of Present Illness:   Cindy Nixon is a 58 y.o. G1P1001 perimenopausal female being seen today for the following concerns:  Irregular bleeding:  To review, pt notes that she has never stopped having periods.  Previously on birth control pills and was not having a period at all.  Then in 2018 started to have heavy periods despite being on the pill.  At that time work up completed including EMB and pelvic US.  EMB showed disordered proliferative endometrium.  She was started and continues to take Medroxyprogesterone (provera) every 10 days per month.  If the bleeding becomes too heavy, she will then take Megace taper and notes significant improvement.  She has not required the Megace for quite some time.  Typically after the provera- will have a few days of BRB spotting.  Denies HMB or passage of clots.    Pt reports significant anxiety about procedure today.  She otherwise reports no acute gyn concerns.  Review of Systems:   Pertinent items are noted in HPI Denies fever/chills, dizziness, headaches, visual disturbances, fatigue, shortness of breath, chest pain, abdominal pain, vomiting Pertinent History Reviewed:   Past Surgical History:  Procedure Laterality Date   BREAST BIOPSY Left 08/13/2012   Procedure: NEEDLE LOCALIZATION left breast mass removal;  Surgeon: Currie Paris, MD;  Location: [facility];  Service: General;  Laterality: Left;   CHOLECYSTECTOMY  08/1991    Past Medical History:  Diagnosis Date   Anemia    Anxiety    Depression    GERD (gastroesophageal reflux disease)    Seasonal allergies    Reviewed problem list, medications and allergies. Physical Assessment:   Vitals:   10/30/22 1035  BP: 139/87  Pulse: 75  Height: 5' (1.524 m)  Body mass index is 69.57 kg/m.       Physical Examination:   General appearance: alert, well appearing, and  in no distress  Psych: mood appropriate, normal affect  Skin: warm & dry   Cardiovascular: normal heart rate noted  Respiratory: normal respiratory effort, no distress  Abdomen: obese, soft, non-tender   Pelvic: VULVA: normal appearing vulva with no masses, tenderness or lesions, VAGINA: normal appearing vagina with normal color and discharge, no lesions, CERVIX: normal appearing cervix without discharge or lesions, UTERUS: uterus is normal size, shape, consistency and nontender- bimanual exam limited due to body habitus  Extremities: no edema   Chaperone: Faith Rogue    Endometrial Biopsy Procedure Note  Pre-operative Diagnosis: AUB  Post-operative Diagnosis: same  Procedure Details    The risks (including infection, bleeding, pain, and uterine perforation) and benefits of the procedure were explained to the patient and Written informed consent was obtained.  Antibiotic prophylaxis against endocarditis was not indicated.   The patient was placed in the dorsal lithotomy position.  Bimanual exam showed the uterus to be in the neutral position.  A speculum inserted in the vagina, and the cervix prepped with betadine.     A single tooth tenaculum was applied to the anterior lip of the cervix for stabilization.  Os finder was used.  A Pipelle endometrial aspirator was used to sample the endometrium.  Sample was sent for pathologic examination.  Condition: Stable  Complications: None   Assessment & Plan:  1) AUB, perimenopausal, morbid obesity -suspect bleeding may be due to withdrawal bleed of progesterone -EMB obtained and further management  pending results []  may consider increasing to progesterone daily pending results  For now continue with medroxyprogesterone as she has been taking it  No orders of the defined types were placed in this encounter.   Return in about 1 year (around 10/30/2023) for Annual.   Myna Hidalgo, DO Attending Obstetrician & Gynecologist, [facility] for [facility], [facility]

## 2022-10-31 ENCOUNTER — Other Ambulatory Visit: Payer: Self-pay | Admitting: Obstetrics & Gynecology

## 2022-10-31 DIAGNOSIS — N938 Other specified abnormal uterine and vaginal bleeding: Secondary | ICD-10-CM

## 2022-10-31 LAB — SURGICAL PATHOLOGY

## 2022-10-31 MED ORDER — MEDROXYPROGESTERONE ACETATE 10 MG PO TABS: ORAL_TABLET | ORAL | 4 refills | Status: AC

## 2022-10-31 NOTE — Progress Notes (Signed)
Called with the results of EMB, no hyperplasia or malignancy  plan to switch from Provera 10 day/month to daily  Should she note any issues with bleeding return to clinic otherwise plan to follow-up for yearly  Myna Hidalgo, DO Attending Obstetrician & Gynecologist, [facility] for [employer], [facility]

## 2023-02-19 DIAGNOSIS — J209 Acute bronchitis, unspecified: Secondary | ICD-10-CM | POA: Diagnosis not present

## 2023-02-19 DIAGNOSIS — E1165 Type 2 diabetes mellitus with hyperglycemia: Secondary | ICD-10-CM | POA: Diagnosis not present

## 2023-02-19 DIAGNOSIS — Z6841 Body Mass Index (BMI) 40.0 and over, adult: Secondary | ICD-10-CM | POA: Diagnosis not present

## 2023-02-19 DIAGNOSIS — F411 Generalized anxiety disorder: Secondary | ICD-10-CM | POA: Diagnosis not present

## 2023-02-19 DIAGNOSIS — E782 Mixed hyperlipidemia: Secondary | ICD-10-CM | POA: Diagnosis not present

## 2023-02-19 DIAGNOSIS — M25562 Pain in left knee: Secondary | ICD-10-CM | POA: Diagnosis not present

## 2023-02-19 DIAGNOSIS — E7849 Other hyperlipidemia: Secondary | ICD-10-CM | POA: Diagnosis not present

## 2023-02-19 DIAGNOSIS — R030 Elevated blood-pressure reading, without diagnosis of hypertension: Secondary | ICD-10-CM | POA: Diagnosis not present

## 2023-02-26 ENCOUNTER — Other Ambulatory Visit: Payer: Self-pay | Admitting: Adult Health

## 2023-09-18 ENCOUNTER — Other Ambulatory Visit (HOSPITAL_COMMUNITY): Payer: Self-pay | Admitting: Family Medicine

## 2023-09-18 DIAGNOSIS — Z1231 Encounter for screening mammogram for malignant neoplasm of breast: Secondary | ICD-10-CM

## 2023-09-24 ENCOUNTER — Ambulatory Visit (HOSPITAL_COMMUNITY)
Admission: RE | Admit: 2023-09-24 | Discharge: 2023-09-24 | Disposition: A | Discharge status: Home / Self Care | Source: Ambulatory Visit | Attending: Family Medicine | Admitting: Family Medicine

## 2023-09-24 ENCOUNTER — Encounter (HOSPITAL_COMMUNITY): Payer: Self-pay

## 2023-09-24 DIAGNOSIS — Z1231 Encounter for screening mammogram for malignant neoplasm of breast: Secondary | ICD-10-CM | POA: Insufficient documentation

## 2023-10-22 ENCOUNTER — Encounter: Payer: Self-pay | Admitting: Internal Medicine

## 2023-11-26 ENCOUNTER — Ambulatory Visit: Admitting: Internal Medicine

## 2023-12-03 ENCOUNTER — Ambulatory Visit (INDEPENDENT_AMBULATORY_CARE_PROVIDER_SITE_OTHER): Admitting: Internal Medicine

## 2023-12-03 ENCOUNTER — Encounter: Payer: Self-pay | Admitting: Internal Medicine

## 2023-12-03 VITALS — BP 130/88 | HR 90 | Temp 98.4°F | Ht 60.0 in | Wt 368.0 lb

## 2023-12-03 DIAGNOSIS — R195 Other fecal abnormalities: Secondary | ICD-10-CM | POA: Diagnosis not present

## 2023-12-03 DIAGNOSIS — K219 Gastro-esophageal reflux disease without esophagitis: Secondary | ICD-10-CM

## 2023-12-03 DIAGNOSIS — Z6841 Body Mass Index (BMI) 40.0 and over, adult: Secondary | ICD-10-CM

## 2023-12-03 NOTE — Patient Instructions (Signed)
 We will reach out to the anesthesia team at [facility].  Once we have heard back we will hopefully schedule you for colonoscopy given your recent positive Cologuard testing.  Continue on omeprazole for your chronic acid reflux.  It was very nice seeing you today.  Dr. Cindie

## 2023-12-03 NOTE — Progress Notes (Signed)
 Primary Care Physician:  Job Bolt, [place] Primary Gastroenterologist:  Dr. Cindie  Chief Complaint  Patient presents with   Positive cologuard    Patient had a recent cologuard that was positive. Patient denies any current gi related issues.     HPI:   Cindy Nixon is a 58 y.o. female who presents to clinic today by referral from her PCP Bolt Job for evaluation.  Patient recently underwent Cologuard testing for colon cancer screening which was positive.  Denies any melena hematochezia.  No abdominal pain or unintentional weight loss.  No family history of colorectal malignancy.  No prior colonoscopy.  Does have chronic GERD, well controlled on omeprazole. Denies any dysphagia/odynophagia. No epigastric or chest pain.  Patient morbidly obese with BMI 71.  Past Medical History:  Diagnosis Date   Anemia    Anxiety    Depression    GERD (gastroesophageal reflux disease)    Seasonal allergies     Past Surgical History:  Procedure Laterality Date   BREAST BIOPSY Left 08/13/2012   Procedure: NEEDLE LOCALIZATION left breast mass removal;  Surgeon: Sherlean JINNY Laughter, MD;  Location: [facility] OR;  Service: General;  Laterality: Left;   CHOLECYSTECTOMY  08/1991    Current Outpatient Medications  Medication Sig Dispense Refill   ALPRAZolam  (XANAX ) 1 MG tablet TAKE 1 TABLET BY MOUTH TWICE DAILY AS NEEDED FOR ANXIETY 60 tablet 3   Ascorbic Acid (VITAMIN C) 100 MG tablet Take 100 mg by mouth daily.     Cyanocobalamin  (VITAMIN B-12 PO) Take by mouth. (Patient taking differently: Take by mouth daily at 6 (six) AM.)     ibuprofen (ADVIL) 800 MG tablet Take 800 mg by mouth 3 (three) times daily as needed.     medroxyPROGESTERone  (PROVERA ) 10 MG tablet TAKE 1 TABLET BY MOUTH EVERY DAY FOR FIRST 10 DAYS OF EVERY MONTH 90 tablet 4   megestrol  (MEGACE ) 40 MG tablet TAKE 3 TABLETS DAILY FOR 5 DAYS THEN TAKE 2 TABLETS DAILY FOR 5 DAYS THEN 1 TABLET BY MOUTH DAILY 135 tablet 3   metFORMIN  (GLUCOPHAGE-XR) 500 MG 24 hr tablet Take 500 mg by mouth daily.     omeprazole (PRILOSEC) 10 MG capsule Take 10 mg by mouth daily. (Patient taking differently: Take 10 mg by mouth as needed.)     oxyCODONE-acetaminophen  (PERCOCET/ROXICET) 5-325 MG tablet Take 1 tablet by mouth daily as needed.     POTASSIUM PO Take 1 tablet by mouth daily.     sertraline (ZOLOFT) 25 MG tablet Take 25 mg by mouth daily.     No current facility-administered medications for this visit.    Allergies as of 12/03/2023   (No Known Allergies)    Family History  Problem Relation Age of Onset   Cancer Mother        ovarian   Breast cancer Paternal Aunt    Breast cancer Paternal Aunt    Breast cancer Paternal Aunt    COPD Father    COPD Brother    Other Daughter        cysts in both breast; had partial mastectomy both breasts   Diabetes Daughter    Ovarian cysts Daughter     Social History   Socioeconomic History   Marital status: Married    Spouse name: Not on file   Number of children: Not on file   Years of education: Not on file   Highest education level: Not on file  Occupational History  Not on file  Tobacco Use   Smoking status: Never   Smokeless tobacco: Never  Vaping Use   Vaping status: Never Used  Substance and Sexual Activity   Alcohol use: No   Drug use: No   Sexual activity: Not Currently    Birth control/protection: Abstinence  Other Topics Concern   Not on file  Social History Narrative   Not on file   Social Drivers of Health   Financial Resource Strain: Low Risk  (10/16/2022)   Overall Financial Resource Strain (CARDIA)    Difficulty of Paying Living Expenses: Not very hard  Food Insecurity: No Food Insecurity (10/16/2022)   Hunger Vital Sign    Worried About Running Out of Food in the Last Year: Never true    Ran Out of Food in the Last Year: Never true  Transportation Needs: No Transportation Needs (10/16/2022)   PRAPARE - Administrator, Civil Service  (Medical): No    Lack of Transportation (Non-Medical): No  Physical Activity: Insufficiently Active (10/16/2022)   Exercise Vital Sign    Days of Exercise per Week: 2 days    Minutes of Exercise per Session: 30 min  Stress: Stress Concern Present (10/16/2022)   [employer] of Occupational Health - Occupational Stress Questionnaire    Feeling of Stress : To some extent  Social Connections: Socially Integrated (10/16/2022)   Social Connection and Isolation Panel    Frequency of Communication with Friends and Family: Three times a week    Frequency of Social Gatherings with Friends and Family: Once a week    Attends Religious Services: 1 to 4 times per year    Active Member of [employer] or Organizations: No    Attends Engineer, structural: 1 to 4 times per year    Marital Status: Married  Catering manager Violence: Not At Risk (10/16/2022)   Humiliation, Afraid, Rape, and Kick questionnaire    Fear of Current or Ex-Partner: No    Emotionally Abused: No    Physically Abused: No    Sexually Abused: No    Subjective: Review of Systems  Constitutional:  Negative for chills and fever.  HENT:  Negative for congestion and hearing loss.   Eyes:  Negative for blurred vision and double vision.  Respiratory:  Negative for cough and shortness of breath.   Cardiovascular:  Negative for chest pain and palpitations.  Gastrointestinal:  Negative for abdominal pain, blood in stool, constipation, diarrhea, heartburn, melena and vomiting.  Genitourinary:  Negative for dysuria and urgency.  Musculoskeletal:  Negative for joint pain and myalgias.  Skin:  Negative for itching and rash.  Neurological:  Negative for dizziness and headaches.  Psychiatric/Behavioral:  Negative for depression. The patient is not nervous/anxious.        Objective: BP 130/88 (BP Location: Left Arm, Patient Position: Sitting, Cuff Size: Normal)   Pulse 90   Temp 98.4 F (36.9 C) (Temporal)   Ht 5' (1.524 m)   Wt  (!) 368 lb (166.9 kg)   BMI 71.87 kg/m  Physical Exam Constitutional:      Appearance: Normal appearance.  HENT:     Head: Normocephalic and atraumatic.  Eyes:     Extraocular Movements: Extraocular movements intact.     Conjunctiva/sclera: Conjunctivae normal.  Cardiovascular:     Rate and Rhythm: Normal rate and regular rhythm.  Pulmonary:     Effort: Pulmonary effort is normal.     Breath sounds: Normal breath sounds.  Abdominal:  General: Bowel sounds are normal.     Palpations: Abdomen is soft.  Musculoskeletal:        General: No swelling. Normal range of motion.     Cervical back: Normal range of motion and neck supple.  Skin:    General: Skin is warm and dry.     Coloration: Skin is not jaundiced.  Neurological:     General: No focal deficit present.     Mental Status: She is alert and oriented to person, place, and time.  Psychiatric:        Mood and Affect: Mood normal.        Behavior: Behavior normal.      Assessment/Plan:  1.  Positive Cologuard testing-Will schedule for screening colonoscopy.The risks including infection, bleed, or perforation as well as benefits, limitations, alternatives and imponderables have been reviewed with the patient. Questions have been answered. All parties agreeable.  Given patient's elevated BMI/morbid obesity will reach out to anesthesia for clearance prior to scheduling.  2.  Chronic GERD-well-controlled Meprazole daily-Will continue.  3.  Morbid obesity- Recommend 1-2# weight loss per week until ideal body weight through exercise & diet. Low fat/cholesterol diet.   Avoid sweets, sodas, fruit juices, sweetened beverages like tea, etc. Gradually increase exercise from 15 min daily up to 1 hr per day 5 days/week.  Thank you Cena Buttner for the kind referral.  12/03/2023 2:35 PM   Disclaimer: This note was dictated with voice recognition software. Similar sounding words can inadvertently be transcribed and may not be  corrected upon review.

## 2023-12-04 ENCOUNTER — Other Ambulatory Visit: Payer: Self-pay | Admitting: *Deleted

## 2023-12-04 DIAGNOSIS — R195 Other fecal abnormalities: Secondary | ICD-10-CM

## 2024-02-24 ENCOUNTER — Other Ambulatory Visit: Payer: Self-pay | Admitting: Adult Health

## 2024-02-26 ENCOUNTER — Other Ambulatory Visit: Payer: Self-pay | Admitting: Adult Health
